# Patient Record
Sex: Female | Born: 1950 | Race: Black or African American | Hispanic: No | Marital: Single | Smoking: Never smoker
Health system: Southern US, Community
[De-identification: ages and names within clinical notes are randomized; demographics above are authoritative.]

## PROBLEM LIST (undated history)

## (undated) DIAGNOSIS — G309 Alzheimer's disease, unspecified: Secondary | ICD-10-CM

## (undated) DIAGNOSIS — E119 Type 2 diabetes mellitus without complications: Secondary | ICD-10-CM

## (undated) DIAGNOSIS — R4702 Dysphasia: Secondary | ICD-10-CM

## (undated) DIAGNOSIS — R569 Unspecified convulsions: Secondary | ICD-10-CM

## (undated) DIAGNOSIS — M1990 Unspecified osteoarthritis, unspecified site: Secondary | ICD-10-CM

## (undated) DIAGNOSIS — G2401 Drug induced subacute dyskinesia: Secondary | ICD-10-CM

## (undated) DIAGNOSIS — Z972 Presence of dental prosthetic device (complete) (partial): Secondary | ICD-10-CM

## (undated) DIAGNOSIS — F411 Generalized anxiety disorder: Secondary | ICD-10-CM

## (undated) DIAGNOSIS — E1122 Type 2 diabetes mellitus with diabetic chronic kidney disease: Secondary | ICD-10-CM

## (undated) DIAGNOSIS — F209 Schizophrenia, unspecified: Secondary | ICD-10-CM

## (undated) HISTORY — PX: ABDOMINAL HYSTERECTOMY: SHX81

## (undated) HISTORY — PX: BREAST BIOPSY: SHX20

---

## 2004-08-07 ENCOUNTER — Ambulatory Visit: Payer: Self-pay | Admitting: Family Medicine

## 2007-06-28 ENCOUNTER — Ambulatory Visit: Payer: Self-pay | Admitting: Family Medicine

## 2008-06-29 ENCOUNTER — Ambulatory Visit: Payer: Self-pay | Admitting: Family Medicine

## 2010-07-28 ENCOUNTER — Ambulatory Visit: Payer: Self-pay | Admitting: Gastroenterology

## 2011-01-22 ENCOUNTER — Ambulatory Visit: Payer: Self-pay | Admitting: Family Medicine

## 2011-02-05 ENCOUNTER — Ambulatory Visit: Payer: Self-pay | Admitting: Family Medicine

## 2011-02-17 ENCOUNTER — Ambulatory Visit: Payer: Self-pay | Admitting: General Surgery

## 2011-02-18 LAB — PATHOLOGY REPORT

## 2011-08-06 ENCOUNTER — Ambulatory Visit: Payer: Self-pay | Admitting: General Surgery

## 2011-08-20 ENCOUNTER — Ambulatory Visit: Payer: Self-pay | Admitting: General Surgery

## 2011-08-24 LAB — PATHOLOGY REPORT

## 2012-02-10 ENCOUNTER — Ambulatory Visit: Payer: Self-pay | Admitting: General Surgery

## 2013-02-09 ENCOUNTER — Emergency Department: Payer: Self-pay | Admitting: Emergency Medicine

## 2013-02-09 LAB — URINALYSIS, COMPLETE
Blood: NEGATIVE
Granular Cast: 5
Hyaline Cast: 8
Ketone: NEGATIVE
Leukocyte Esterase: NEGATIVE
Nitrite: POSITIVE
Ph: 6 (ref 4.5–8.0)
Protein: 100
RBC,UR: <1 /HPF (ref 0–5)
Specific Gravity: 1.012 (ref 1.003–1.030)
Squamous Epithelial: <1
WBC UR: 1 /HPF (ref 0–5)

## 2013-02-09 LAB — CBC WITH DIFFERENTIAL/PLATELET
Basophil #: 0 10*3/uL (ref 0.0–0.1)
Basophil %: 0.8 %
Eosinophil #: 0 10*3/uL (ref 0.0–0.7)
HCT: 43 % (ref 35.0–47.0)
HGB: 14.4 g/dL (ref 12.0–16.0)
MCH: 30.4 pg (ref 26.0–34.0)
Monocyte #: 0.3 x10 3/mm (ref 0.2–0.9)
Neutrophil #: 3.3 10*3/uL (ref 1.4–6.5)
Neutrophil %: 58.9 %
Platelet: 241 10*3/uL (ref 150–440)
RDW: 13 % (ref 11.5–14.5)

## 2013-02-09 LAB — COMPREHENSIVE METABOLIC PANEL
Albumin: 3.6 g/dL (ref 3.4–5.0)
Alkaline Phosphatase: 90 U/L (ref 50–136)
Anion Gap: 20 — ABNORMAL HIGH (ref 7–16)
BUN: 10 mg/dL (ref 7–18)
Co2: 13 mmol/L — ABNORMAL LOW (ref 21–32)
Creatinine: 1.13 mg/dL (ref 0.60–1.30)
Osmolality: 280 (ref 275–301)
Potassium: 4.1 mmol/L (ref 3.5–5.1)

## 2013-02-09 LAB — DRUG SCREEN, URINE
Barbiturates, Ur Screen: NEGATIVE (ref ?–200)
MDMA (Ecstasy)Ur Screen: NEGATIVE (ref ?–500)
Methadone, Ur Screen: NEGATIVE (ref ?–300)
Opiate, Ur Screen: NEGATIVE (ref ?–300)
Phencyclidine (PCP) Ur S: NEGATIVE (ref ?–25)

## 2013-02-19 ENCOUNTER — Emergency Department: Payer: Self-pay | Admitting: Internal Medicine

## 2013-02-19 LAB — COMPREHENSIVE METABOLIC PANEL
Albumin: 2.8 g/dL — ABNORMAL LOW (ref 3.4–5.0)
Anion Gap: 3 — ABNORMAL LOW (ref 7–16)
BUN: 6 mg/dL — ABNORMAL LOW (ref 7–18)
Bilirubin,Total: 0.4 mg/dL (ref 0.2–1.0)
Calcium, Total: 8.6 mg/dL (ref 8.5–10.1)
Chloride: 101 mmol/L (ref 98–107)
Co2: 32 mmol/L (ref 21–32)
EGFR (African American): >60
EGFR (Non-African Amer.): >60
Glucose: 131 mg/dL — ABNORMAL HIGH (ref 65–99)
Potassium: 3.7 mmol/L (ref 3.5–5.1)
SGOT(AST): 31 U/L (ref 15–37)
SGPT (ALT): 39 U/L (ref 12–78)
Sodium: 136 mmol/L (ref 136–145)

## 2013-02-19 LAB — URINALYSIS, COMPLETE
Glucose,UR: NEGATIVE mg/dL (ref 0–75)
Ketone: NEGATIVE
Nitrite: POSITIVE
Ph: 7 (ref 4.5–8.0)
Protein: NEGATIVE
RBC,UR: 1 /HPF (ref 0–5)
Specific Gravity: 1.008 (ref 1.003–1.030)
Squamous Epithelial: 10
WBC UR: 8 /HPF (ref 0–5)

## 2013-02-19 LAB — CBC
HCT: 38.3 % (ref 35.0–47.0)
MCHC: 33.8 g/dL (ref 32.0–36.0)
RBC: 4.31 10*6/uL (ref 3.80–5.20)
WBC: 9.4 10*3/uL (ref 3.6–11.0)

## 2013-02-19 LAB — PROTIME-INR: INR: 1

## 2013-02-24 LAB — CULTURE, BLOOD (SINGLE)

## 2013-08-30 ENCOUNTER — Emergency Department: Payer: Self-pay | Admitting: Emergency Medicine

## 2013-08-30 LAB — URINALYSIS, COMPLETE
Bacteria: NONE SEEN
Bilirubin,UR: NEGATIVE
Blood: NEGATIVE
Glucose,UR: NEGATIVE mg/dL (ref 0–75)
Ketone: NEGATIVE
Leukocyte Esterase: NEGATIVE
Nitrite: NEGATIVE
PH: 6 (ref 4.5–8.0)
Protein: NEGATIVE
Specific Gravity: 1.018 (ref 1.003–1.030)

## 2013-08-30 LAB — CBC
HCT: 45.5 % (ref 35.0–47.0)
HGB: 14.9 g/dL (ref 12.0–16.0)
MCH: 29.5 pg (ref 26.0–34.0)
MCHC: 32.8 g/dL (ref 32.0–36.0)
MCV: 90 fL (ref 80–100)
Platelet: 267 10*3/uL (ref 150–440)
RBC: 5.05 10*6/uL (ref 3.80–5.20)
RDW: 13 % (ref 11.5–14.5)
WBC: 6.1 10*3/uL (ref 3.6–11.0)

## 2013-08-30 LAB — COMPREHENSIVE METABOLIC PANEL
AST: 14 U/L — AB (ref 15–37)
Albumin: 3.9 g/dL (ref 3.4–5.0)
Alkaline Phosphatase: 83 U/L
Anion Gap: 4 — ABNORMAL LOW (ref 7–16)
BILIRUBIN TOTAL: 0.7 mg/dL (ref 0.2–1.0)
BUN: 11 mg/dL (ref 7–18)
CHLORIDE: 105 mmol/L (ref 98–107)
CREATININE: 0.87 mg/dL (ref 0.60–1.30)
Calcium, Total: 9.3 mg/dL (ref 8.5–10.1)
Co2: 30 mmol/L (ref 21–32)
EGFR (African American): >60
GLUCOSE: 90 mg/dL (ref 65–99)
Osmolality: 276 (ref 275–301)
Potassium: 3.5 mmol/L (ref 3.5–5.1)
SGPT (ALT): 19 U/L (ref 12–78)
Sodium: 139 mmol/L (ref 136–145)
Total Protein: 7.1 g/dL (ref 6.4–8.2)

## 2013-08-30 LAB — DRUG SCREEN, URINE
Amphetamines, Ur Screen: NEGATIVE (ref ?–1000)
Barbiturates, Ur Screen: NEGATIVE (ref ?–200)
Benzodiazepine, Ur Scrn: NEGATIVE (ref ?–200)
CANNABINOID 50 NG, UR ~~LOC~~: NEGATIVE (ref ?–50)
Cocaine Metabolite,Ur ~~LOC~~: NEGATIVE (ref ?–300)
MDMA (ECSTASY) UR SCREEN: NEGATIVE (ref ?–500)
Methadone, Ur Screen: NEGATIVE (ref ?–300)
OPIATE, UR SCREEN: NEGATIVE (ref ?–300)
PHENCYCLIDINE (PCP) UR S: NEGATIVE (ref ?–25)
Tricyclic, Ur Screen: NEGATIVE (ref ?–1000)

## 2013-08-30 LAB — SALICYLATE LEVEL

## 2013-08-30 LAB — ETHANOL
Ethanol %: <0.003 % (ref 0.000–0.080)
Ethanol: <3 mg/dL

## 2013-08-30 LAB — ACETAMINOPHEN LEVEL: Acetaminophen: <2 ug/mL

## 2014-02-13 ENCOUNTER — Ambulatory Visit: Payer: Self-pay | Admitting: Family Medicine
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
## 2014-07-14 NOTE — Consult Note (Signed)
PATIENT NAME:  Tina Holden, Tina Holden MR#:  [id number] DATE OF BIRTH:  Nov 04, 1950  DATE OF CONSULTATION:  08/30/2013  REFERRING PHYSICIAN:   CONSULTING PHYSICIAN:  Gonzella Lex, MD  IDENTIFYING INFORMATION AND REASON FOR CONSULTATION:  A 64 year old woman who was brought here from her group home with a report that she had been yelling in the night and they had thought she was hallucinating.   HISTORY OF PRESENT ILLNESS:  Information obtained from the patient and the chart.  The patient tells me that the group home thinks that she was yelling and hallucinating, but she does not remember any of it and does not have any report of hallucinating recently.  She says that she goes to sleep about 7:30, wakes up sometimes in the night, but usually gets up first thing in the morning because she goes to work at [employer] several days a week.  She denies that she has had any change in her mood recently.  Denies depression, anxiety, or anger.  She denies that she has been having any hallucinations.  Does not complain of any sleep problems.  Does not have any medical problems she is complaining of.  She is pleasant and cooperative, but not a very good historian.  Does not appear to be in any particular distress.  She does not know of any recent changes to any of her medicine.   PAST PSYCHIATRIC HISTORY:  The patient indicates that she has had a long-standing mental health problem probably pretty much all of her life.  She currently resides in a group home.  She has a diagnosis of schizophrenia.  There is some report about dementia on here as well.  She certainly has some cognitive impairment.  It is also possible that she could have developmental disability.  In any case, she is chronically ill.  No previous psychiatric treatment reported here at this hospital.  She cannot remember when she was last in the hospital.  Denies any history of suicidal or homicidal or aggressive behavior.  She is currently on medication  prescribed by Dr. Brunetta Genera for her psychiatric problems as well as other problems.   SOCIAL HISTORY:  Lives in a group home, says she has been there for a couple of years, has no complaints about it.  The patient says that her elderly mother who is 66 years old is still actively involved in her life and helps to keep her medicine bottles filled.  I do not have any reason to doubt her.  She has never been married herself, has no children.  She does get occasional visits from her extended family.   PAST MEDICAL HISTORY:  She has a history of a seizure disorder from what I see in the chart and was here at our hospital once in the past for it, WAS LOADED ON [place].  She remembers this and tells me that it just happened last week, but that is not quite accurate.  In fact, it looks like it happened in November of 2014.  She is not currently taking any antiseizure medicine according to the notes.  I am not sure what the whole history is with that.  FAMILY HISTORY:  None known.   SUBSTANCE ABUSE HISTORY:  Denies any alcohol or drug abuse currently or in the past.   CURRENT MEDICATIONS:  According to the referral information she is on Cogentin 1 mg twice a day, Navane 10 mg once a  day at night, Os-Cal 50 with vitamin D one tablet twice a day, vitamin B12 1000 mcg once a day, vitamin D3 400 international units once a day, Zantac 150 mg at night, Ativan 0.5 mg at night, gabapentin 300 mg twice a day, Tylenol as needed and promethazine as needed.   ALLERGIES:  REPORTED AS ASPIRIN, BUTALBITAL, CAFFEINE, CODEINE, CHOCOLATE, OTHER; I AM PRESUMING DILANTIN FROM THE HISTORY I SEE.   MENTAL STATUS EXAMINATION:  Neatly groomed woman, looks her stated age, very pleasant and cooperative.  Reading the Bible when I came in to see her, she was as I said, quite cooperative with the interview.  Makes good eye contact, had normal psychomotor activity.  Her  speech is a little bit quiet, normal in rate.  Affect is smiling and upbeat.  Mood is stated as okay.  Thoughts are a little bit scattered.  She tends to answer questions at times with nonsequiturs, although she can give basic information.  Denies any hallucinations.  Denies feeling paranoid.  Denies feeling suicidal or homicidal.  She is alert and oriented to being in the hospital and to where she lives and some basics about her life.  Appears to probably be chronically cognitively impaired.  Full cognitive testing not done right now.   LABORATORY RESULTS:  Alcohol level negative.  Salicylates and acetaminophen negative.  Chemistry unremarkable.  CBC unremarkable.  Urinalysis unremarkable.  Drug screen negative.   VITAL SIGNS:  Blood pressure 111/78, respirations 18, pulse 91, temperature 98.7.   ASSESSMENT:  A 64 year old woman with chronic mental health problems.  Group home brought her over here with report I have been told that she was yelling and they thought she was hallucinating.  The patient denies all of it.  She does not seem perturbed by it, but does not have any complaints of her own.  At this point, there does not seem to be any clear symptom or syndrome that requires any intervention or treatment.  The patient no longer needs hospital level care.   TREATMENT PLAN:  Recommend that she be released to go back to her group home.  We have found that she has an appointment scheduled with her primary care doctor tomorrow anyway who can follow up if any change in her medications known as he probably knows her chronic condition better.   DIAGNOSIS, PRINCIPAL AND PRIMARY:  AXIS I:  Schizophrenia, undifferentiated.   SECONDARY DIAGNOSES: AXIS I:  No further.  AXIS II:  Rule out developmental disability.  AXIS III:  History of seizure disorder.  AXIS IV:  Moderate, chronic from illness.  AXIS V:  Functioning at time of evaluation 53.     ____________________________ Gonzella Lex,  MD jtc:ea D: 08/30/2013 16:57:56 ET T: 08/30/2013 18:07:56 ET JOB#: [id number]  cc: Gonzella Lex, MD, <Dictator> Gonzella Lex MD ELECTRONICALLY SIGNED 09/06/2013 13:25

## 2014-07-14 NOTE — Consult Note (Signed)
Brief Consult Note: Diagnosis: schizophrenia.   Patient was seen by consultant.   Consult note dictated.   Discussed with Attending MD.   Comments: Psychiatry: PAtient seen. Chart reviewed. Patient brought in voluntarily with report fronm group home that she was yelling in the night. Patient denies it and has no complaints at all of her own. Pleasant and cooperative. No indication for hospitalization or change in psychiatric treatment. Suggest dc back to group home and continue follow up with primary physician.  Electronic Signatures: Gonzella Lex (MD)  (Signed 10-Jun-15 16:43)  Authored: Brief Consult Note   Last Updated: 10-Jun-15 16:43 by Gonzella Lex (MD)

## 2014-12-11 ENCOUNTER — Inpatient Hospital Stay
Admission: EM | Admit: 2014-12-11 | Discharge: 2014-12-14 | DRG: 100 | Disposition: A | Discharge status: Home Health | Payer: Medicare Other | Attending: Internal Medicine | Admitting: Internal Medicine

## 2014-12-11 ENCOUNTER — Emergency Department: Payer: Medicare Other

## 2014-12-11 ENCOUNTER — Encounter: Payer: Self-pay | Admitting: Emergency Medicine

## 2014-12-11 DIAGNOSIS — Z9102 Food additives allergy status: Secondary | ICD-10-CM | POA: Diagnosis not present

## 2014-12-11 DIAGNOSIS — G40909 Epilepsy, unspecified, not intractable, without status epilepticus: Principal | ICD-10-CM | POA: Diagnosis present

## 2014-12-11 DIAGNOSIS — R410 Disorientation, unspecified: Secondary | ICD-10-CM | POA: Diagnosis not present

## 2014-12-11 DIAGNOSIS — N289 Disorder of kidney and ureter, unspecified: Secondary | ICD-10-CM | POA: Diagnosis present

## 2014-12-11 DIAGNOSIS — F209 Schizophrenia, unspecified: Secondary | ICD-10-CM | POA: Diagnosis present

## 2014-12-11 DIAGNOSIS — R569 Unspecified convulsions: Secondary | ICD-10-CM

## 2014-12-11 DIAGNOSIS — M1990 Unspecified osteoarthritis, unspecified site: Secondary | ICD-10-CM | POA: Diagnosis present

## 2014-12-11 DIAGNOSIS — B9620 Unspecified Escherichia coli [E. coli] as the cause of diseases classified elsewhere: Secondary | ICD-10-CM | POA: Diagnosis present

## 2014-12-11 DIAGNOSIS — K219 Gastro-esophageal reflux disease without esophagitis: Secondary | ICD-10-CM | POA: Diagnosis present

## 2014-12-11 DIAGNOSIS — G9341 Metabolic encephalopathy: Secondary | ICD-10-CM | POA: Diagnosis present

## 2014-12-11 DIAGNOSIS — E119 Type 2 diabetes mellitus without complications: Secondary | ICD-10-CM | POA: Diagnosis present

## 2014-12-11 DIAGNOSIS — E538 Deficiency of other specified B group vitamins: Secondary | ICD-10-CM | POA: Diagnosis present

## 2014-12-11 DIAGNOSIS — N3000 Acute cystitis without hematuria: Secondary | ICD-10-CM | POA: Diagnosis present

## 2014-12-11 DIAGNOSIS — Z79899 Other long term (current) drug therapy: Secondary | ICD-10-CM

## 2014-12-11 DIAGNOSIS — F79 Unspecified intellectual disabilities: Secondary | ICD-10-CM | POA: Diagnosis present

## 2014-12-11 DIAGNOSIS — D72829 Elevated white blood cell count, unspecified: Secondary | ICD-10-CM

## 2014-12-11 DIAGNOSIS — R531 Weakness: Secondary | ICD-10-CM | POA: Diagnosis present

## 2014-12-11 DIAGNOSIS — Z886 Allergy status to analgesic agent status: Secondary | ICD-10-CM

## 2014-12-11 DIAGNOSIS — Z888 Allergy status to other drugs, medicaments and biological substances status: Secondary | ICD-10-CM | POA: Diagnosis not present

## 2014-12-11 DIAGNOSIS — F89 Unspecified disorder of psychological development: Secondary | ICD-10-CM

## 2014-12-11 DIAGNOSIS — N390 Urinary tract infection, site not specified: Secondary | ICD-10-CM

## 2014-12-11 DIAGNOSIS — Z885 Allergy status to narcotic agent status: Secondary | ICD-10-CM | POA: Diagnosis not present

## 2014-12-11 HISTORY — DX: Type 2 diabetes mellitus without complications: E11.9

## 2014-12-11 HISTORY — DX: Unspecified osteoarthritis, unspecified site: M19.90

## 2014-12-11 HISTORY — DX: Unspecified convulsions: R56.9

## 2014-12-11 LAB — CBC
HCT: 45.7 % (ref 35.0–47.0)
HEMOGLOBIN: 15.6 g/dL (ref 12.0–16.0)
MCH: 29.6 pg (ref 26.0–34.0)
MCHC: 34 g/dL (ref 32.0–36.0)
MCV: 87.1 fL (ref 80.0–100.0)
Platelets: 254 10*3/uL (ref 150–440)
RBC: 5.25 MIL/uL — ABNORMAL HIGH (ref 3.80–5.20)
RDW: 13.2 % (ref 11.5–14.5)
WBC: 10.5 10*3/uL (ref 3.6–11.0)

## 2014-12-11 LAB — GLUCOSE, CAPILLARY: GLUCOSE-CAPILLARY: 145 mg/dL — AB (ref 65–99)

## 2014-12-11 LAB — COMPREHENSIVE METABOLIC PANEL
ALBUMIN: 5 g/dL (ref 3.5–5.0)
ALK PHOS: 85 U/L (ref 38–126)
ALT: 20 U/L (ref 14–54)
ANION GAP: 7 (ref 5–15)
AST: 24 U/L (ref 15–41)
BILIRUBIN TOTAL: 1.1 mg/dL (ref 0.3–1.2)
BUN: 14 mg/dL (ref 6–20)
CALCIUM: 9.7 mg/dL (ref 8.9–10.3)
CO2: 27 mmol/L (ref 22–32)
Chloride: 105 mmol/L (ref 101–111)
Creatinine, Ser: 1.07 mg/dL — ABNORMAL HIGH (ref 0.44–1.00)
GFR calc Af Amer: >60 mL/min (ref >60)
GFR, EST NON AFRICAN AMERICAN: 53 mL/min — AB (ref >60)
GLUCOSE: 148 mg/dL — AB (ref 65–99)
Potassium: 3.9 mmol/L (ref 3.5–5.1)
Sodium: 139 mmol/L (ref 135–145)
TOTAL PROTEIN: 8.1 g/dL (ref 6.5–8.1)

## 2014-12-11 LAB — URINALYSIS COMPLETE WITH MICROSCOPIC (ARMC ONLY)
Bilirubin Urine: NEGATIVE
GLUCOSE, UA: NEGATIVE mg/dL
Hgb urine dipstick: NEGATIVE
NITRITE: NEGATIVE
Protein, ur: NEGATIVE mg/dL
Specific Gravity, Urine: 1.017 (ref 1.005–1.030)
Trans Epithel, UA: 5
pH: 6 (ref 5.0–8.0)

## 2014-12-11 LAB — TROPONIN I

## 2014-12-11 MED ORDER — DEXTROSE 5 % IV SOLN
1.0000 g | Freq: Once | INTRAVENOUS | Status: AC
  Administered 2014-12-11: 1 g via INTRAVENOUS
  Filled 2014-12-11: qty 10

## 2014-12-11 MED ORDER — SODIUM CHLORIDE 0.9 % IV SOLN
500.0000 mg | Freq: Two times a day (BID) | INTRAVENOUS | Status: DC
  Administered 2014-12-12 – 2014-12-13 (×3): 500 mg via INTRAVENOUS
  Filled 2014-12-11 (×6): qty 5

## 2014-12-11 MED ORDER — TRAZODONE HCL 100 MG PO TABS: 100.0000 mg | ORAL_TABLET | Freq: Every evening | ORAL | Status: DC | PRN

## 2014-12-11 MED ORDER — FAMOTIDINE 20 MG PO TABS
20.0000 mg | ORAL_TABLET | Freq: Every day | ORAL | Status: DC
  Administered 2014-12-12 – 2014-12-14 (×3): 20 mg via ORAL
  Filled 2014-12-11 (×4): qty 1

## 2014-12-11 MED ORDER — ALBUTEROL SULFATE (2.5 MG/3ML) 0.083% IN NEBU: 2.5000 mg | INHALATION_SOLUTION | RESPIRATORY_TRACT | Status: DC | PRN

## 2014-12-11 MED ORDER — SODIUM CHLORIDE 0.9 % IV SOLN
INTRAVENOUS | Status: DC
  Administered 2014-12-11 – 2014-12-12 (×3): via INTRAVENOUS

## 2014-12-11 MED ORDER — HEPARIN SODIUM (PORCINE) 5000 UNIT/ML IJ SOLN
5000.0000 [IU] | Freq: Three times a day (TID) | INTRAMUSCULAR | Status: DC
  Administered 2014-12-11 – 2014-12-14 (×9): 5000 [IU] via SUBCUTANEOUS
  Filled 2014-12-11 (×9): qty 1

## 2014-12-11 MED ORDER — BENZTROPINE MESYLATE 1 MG PO TABS
1.0000 mg | ORAL_TABLET | Freq: Two times a day (BID) | ORAL | Status: DC
  Administered 2014-12-12 – 2014-12-14 (×4): 1 mg via ORAL
  Filled 2014-12-11 (×4): qty 1

## 2014-12-11 MED ORDER — TRAZODONE HCL 100 MG PO TABS
100.0000 mg | ORAL_TABLET | Freq: Every day | ORAL | Status: DC
  Administered 2014-12-12 – 2014-12-13 (×2): 100 mg via ORAL
  Filled 2014-12-11 (×2): qty 1

## 2014-12-11 MED ORDER — GABAPENTIN 300 MG PO CAPS
300.0000 mg | ORAL_CAPSULE | Freq: Two times a day (BID) | ORAL | Status: DC
Start: 2014-12-11 — End: 2014-12-14
  Administered 2014-12-12 – 2014-12-14 (×4): 300 mg via ORAL
  Filled 2014-12-11 (×4): qty 1

## 2014-12-11 MED ORDER — ACETAMINOPHEN 325 MG PO TABS: 650.0000 mg | ORAL_TABLET | Freq: Four times a day (QID) | ORAL | Status: DC | PRN

## 2014-12-11 MED ORDER — ONDANSETRON HCL 4 MG/2ML IJ SOLN: 4.0000 mg | Freq: Four times a day (QID) | INTRAMUSCULAR | Status: DC | PRN

## 2014-12-11 MED ORDER — ACETAMINOPHEN 650 MG RE SUPP: 650.0000 mg | Freq: Four times a day (QID) | RECTAL | Status: DC | PRN

## 2014-12-11 MED ORDER — SODIUM CHLORIDE 0.9 % IV SOLN
1000.0000 mg | Freq: Once | INTRAVENOUS | Status: AC
  Administered 2014-12-11: 1000 mg via INTRAVENOUS
  Filled 2014-12-11: qty 10

## 2014-12-11 MED ORDER — THIOTHIXENE 5 MG PO CAPS
5.0000 mg | ORAL_CAPSULE | Freq: Every day | ORAL | Status: DC
Start: 2014-12-11 — End: 2014-12-14
  Administered 2014-12-12 – 2014-12-13 (×2): 5 mg via ORAL
  Filled 2014-12-11 (×5): qty 1

## 2014-12-11 MED ORDER — DEXTROSE 5 % IV SOLN
1.0000 g | INTRAVENOUS | Status: DC
  Administered 2014-12-11 – 2014-12-13 (×2): 1 g via INTRAVENOUS
  Filled 2014-12-11 (×4): qty 10

## 2014-12-11 MED ORDER — ONDANSETRON HCL 4 MG PO TABS: 4.0000 mg | ORAL_TABLET | Freq: Four times a day (QID) | ORAL | Status: DC | PRN

## 2014-12-11 MED ORDER — POLYETHYLENE GLYCOL 3350 17 G PO PACK: 17.0000 g | PACK | Freq: Every day | ORAL | Status: DC | PRN

## 2014-12-11 NOTE — Progress Notes (Signed)
Pt was admitted from ED to [facility] for altered mental status and observe seizure status this am that resulted in a fall. Pt is very lethargic, affect is flat at times, unable to swallow at this time, NPO, swallowing eval pending. Pt on fall, aspiration, and seizure precautions. A/O X2. No seizure activity observed, will begin on IV anti-seizure this pm. Nursing will continue to monitor.

## 2014-12-11 NOTE — H&P (Addendum)
[name] [place] NAME: Tina Holden    MR#:  [id number]  DATE OF BIRTH:  September 27, 1950  DATE OF ADMISSION:  12/11/2014  PRIMARY CARE PHYSICIAN: No primary care provider on file.   REQUESTING/REFERRING PHYSICIAN: Joanne Gavel, MD  CHIEF COMPLAINT:   Chief Complaint  Patient presents with  . Seizures   seizure today  HISTORY OF PRESENT ILLNESS:  Tina Holden  is a 64 y.o. female with a known history of seizure disorder, diabetes and arthritis. The patient was sent from facility to the ED due to seizure activity today. The patient is confused and unable to provide any information. According to patient's health care giver, the patient was fine until this morning when she was found to be confused after placing the seizure. According to health care giver, the patient was found incontinence of urine and confused this morning. But they didn't witnessed CT C seizure.  PAST MEDICAL HISTORY:   Past Medical History  Diagnosis Date  . Seizures   . Diabetes mellitus without complication   . Arthritis     PAST SURGICAL HISTORY:  History reviewed. No pertinent past surgical history.  SOCIAL HISTORY:   Social History  Substance Use Topics  . Smoking status: Never Smoker   . Smokeless tobacco: Not on file  . Alcohol Use: No    FAMILY HISTORY:  History reviewed. No pertinent family history.  DRUG ALLERGIES:   Allergies  Allergen Reactions  . Asa [Aspirin] Other (See Comments)    Unknown   . Chocolate Flavor   . Codeine Other (See Comments)    unknown  . Dilantin [Phenytoin Sodium Extended]     REVIEW OF SYSTEMS:  He shouldn't is confused and unable to provide ROS.  MEDICATIONS AT HOME:   Prior to Admission medications   Medication Sig Start Date End Date Taking? Authorizing Provider  benztropine (COGENTIN) 1 MG tablet Take 1 mg by mouth 2 (two) times daily.   Yes Historical Provider, MD  calcium-vitamin D (OSCAL  WITH D) 500-200 MG-UNIT per tablet Take 1 tablet by mouth 2 (two) times daily.   Yes Historical Provider, MD  Cholecalciferol 5000 UNITS capsule Take 5,000 Units by mouth daily.   Yes Historical Provider, MD  gabapentin (NEURONTIN) 300 MG capsule Take 300 mg by mouth 2 (two) times daily.   Yes Historical Provider, MD  nystatin (MYCOSTATIN/NYSTOP) 100000 UNIT/GM POWD Apply 1 Bottle topically 2 (two) times daily as needed. Apply a small amount   Yes Historical Provider, MD  polyethylene glycol (MIRALAX / GLYCOLAX) packet Take 17 g by mouth daily as needed for mild constipation.   Yes Historical Provider, MD  ranitidine (ZANTAC) 150 MG tablet Take 150 mg by mouth at bedtime.   Yes Historical Provider, MD  thiothixene (NAVANE) 5 MG capsule Take 5 mg by mouth at bedtime.   Yes Historical Provider, MD  traZODone (DESYREL) 100 MG tablet Take 100 mg by mouth at bedtime.   Yes Historical Provider, MD  traZODone (DESYREL) 100 MG tablet Take 100 mg by mouth at bedtime as needed for sleep.   Yes Historical Provider, MD  triamcinolone cream (KENALOG) 0.1 % Apply 1 application topically daily as needed.   Yes Historical Provider, MD  vitamin B-12 (CYANOCOBALAMIN) 1000 MCG tablet Take 1,000 mcg by mouth daily.   Yes Historical Provider, MD      VITAL SIGNS:  Blood pressure 132/84, pulse 72, temperature 98.3 F (36.8 C), temperature source  Oral, resp. rate 30, height 5\' 6"  (1.676 m), weight 81.647 kg (180 lb), SpO2 98 %.  PHYSICAL EXAMINATION:  GENERAL:  64 y.o.-year-old patient lying in the bed with no acute distress.  EYES: Pupils equal, round, reactive to light and accommodation. No scleral icterus. Extraocular muscles intact.  HEENT: Head atraumatic, normocephalic. Oropharynx and nasopharynx clear.  NECK:  Supple, no jugular venous distention. No thyroid enlargement, no tenderness.  LUNGS: Normal breath sounds bilaterally, no wheezing, rales,rhonchi or crepitation. No use of accessory muscles of  respiration.  CARDIOVASCULAR: S1, S2 normal. No murmurs, rubs, or gallops.  ABDOMEN: Soft, nontender, nondistended. Bowel sounds present. No organomegaly or mass.  EXTREMITIES: No pedal edema, cyanosis, or clubbing.  NEUROLOGIC: Cranial nerves II through XII are intact. Muscle strength 4/5 in all extremities. Sensation intact. Gait not checked.  PSYCHIATRIC: The patient is confused. Only know her name. SKIN: No obvious rash, lesion, or ulcer.   LABORATORY PANEL:   CBC  Recent Labs Lab 12/11/14 1022  WBC 10.5  HGB 15.6  HCT 45.7  PLT 254   ------------------------------------------------------------------------------------------------------------------  Chemistries   Recent Labs Lab 12/11/14 1022  NA 139  K 3.9  CL 105  CO2 27  GLUCOSE 148*  BUN 14  CREATININE 1.07*  CALCIUM 9.7  AST 24  ALT 20  ALKPHOS 85  BILITOT 1.1   ------------------------------------------------------------------------------------------------------------------  Cardiac Enzymes  Recent Labs Lab 12/11/14 1022  TROPONINI <0.03   ------------------------------------------------------------------------------------------------------------------  RADIOLOGY:  Ct Head Wo Contrast  12/11/2014   CLINICAL DATA:  Altered mental status today. History of seizures. Status post fall.  EXAM: CT HEAD WITHOUT CONTRAST  TECHNIQUE: Contiguous axial images were obtained from the base of the skull through the vertex without intravenous contrast.  COMPARISON:  Head CT scan 02/09/2013.  FINDINGS: There is no evidence of acute intracranial abnormality including hemorrhage, infarct, mass lesion, mass effect, midline shift or abnormal extra-axial fluid collection. No hydrocephalus or pneumocephalus. The calvarium is intact.  IMPRESSION: Negative exam.   Electronically Signed   By: Inge Rise M.D.   On: 12/11/2014 11:08   Dg Chest Portable 1 View  12/11/2014   CLINICAL DATA:  Lethargy today.  History of  seizures.  EXAM: PORTABLE CHEST - 1 VIEW  COMPARISON:  Single view of the chest 02/09/2013.  FINDINGS: The patient is rotated on the exam. Lung volumes are low but the lungs are clear. Heart size is normal. No pneumothorax or pleural effusion.  IMPRESSION: No acute finding in a low volume chest.   Electronically Signed   By: Inge Rise M.D.   On: 12/11/2014 11:30    EKG:   Orders placed or performed during the hospital encounter of 12/11/14  . EKG 12-Lead  . EKG 12-Lead  . ED EKG  . ED EKG    IMPRESSION AND PLAN:   Seizure UTI diabetes  The patient will be admitted to medical floor. Start neuro check, aspiration, seizure and fall precaution. Neurology consult. Continue Keppra IV twice a day. Continue Rocephin and follow-up urine culture. Start a sliding scale.  All the records are reviewed and case discussed with ED provider. Management plans discussed with the patient's sister and brother, poor patient's POA and they are in agreement.  CODE STATUS: Full code  TOTAL TIME TAKING CARE OF THIS PATIENT: 55 minutes.    Demetrios Loll M.D on 12/11/2014 at 2:06 PM  Between 7am to 6pm - Pager - [phone]  After 6pm go to [email or web address] - [place]  [facility]  Office  [phone]  CC: Primary care physician; No primary care provider on file.

## 2014-12-11 NOTE — ED Provider Notes (Signed)
[facility] Emergency Department Provider Note  ____________________________________________  Time seen: Approximately 10:27 AM  I have reviewed the triage vital signs and the nursing notes.   HISTORY  Chief Complaint Seizures  Caveat-history of present illness and review of systems Limited secondary to the patient's postictal state. Information obtained partially from the patient as well as family at bedside and her caregiver from her family care home.  HPI Tina Holden is a 64 y.o. 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 According to staff at the family care home where she lives, she was found in bed this morning incontinent of urine and confused. When they tried to walk her, she reported feeling dizzy. No recent illness. No falls. Patient has no pain complaints at this time. She has had no witnessed  GTC seizure.   Past Medical History  Diagnosis Date  . Seizures   . Diabetes mellitus without complication   . Arthritis     Patient Active Problem List   Diagnosis Date Noted  . Seizure 12/11/2014    History reviewed. No pertinent past surgical history.  Current Outpatient Rx  Name  Route  Sig  Dispense  Refill  . benztropine (COGENTIN) 1 MG tablet   Oral   Take 1 mg by mouth 2 (two) times daily.         . calcium-vitamin D (OSCAL WITH D) 500-200 MG-UNIT per tablet   Oral   Take 1 tablet by mouth 2 (two) times daily.         . Cholecalciferol 5000 UNITS capsule   Oral   Take 5,000 Units by mouth daily.         Marland Kitchen gabapentin (NEURONTIN) 300 MG capsule   Oral   Take 300 mg by mouth 2 (two) times daily.         Marland Kitchen nystatin (MYCOSTATIN/NYSTOP) 100000 UNIT/GM POWD   Topical   Apply 1 Bottle topically 2 (two) times daily as needed. Apply a small amount         .  polyethylene glycol (MIRALAX / GLYCOLAX) packet   Oral   Take 17 g by mouth daily as needed for mild constipation.         . ranitidine (ZANTAC) 150 MG tablet   Oral   Take 150 mg by mouth at bedtime.         Marland Kitchen thiothixene (NAVANE) 5 MG capsule   Oral   Take 5 mg by mouth at bedtime.         . traZODone (DESYREL) 100 MG tablet   Oral   Take 100 mg by mouth at bedtime.         . traZODone (DESYREL) 100 MG tablet   Oral   Take 100 mg by mouth at bedtime as needed for sleep.         Marland Kitchen triamcinolone cream (KENALOG) 0.1 %   Topical   Apply 1 application topically daily as needed.         . vitamin B-12 (CYANOCOBALAMIN) 1000 MCG tablet   Oral   Take 1,000 mcg by mouth daily.           Allergies Asa; Chocolate flavor; Codeine; and Dilantin  History reviewed. No pertinent family history.  Social History Social History  Substance Use Topics  . Smoking status: Never Smoker   . Smokeless tobacco:  None  . Alcohol Use: No    Review of Systems Constitutional: No fever/chills Eyes: No visual changes. ENT: No sore throat. Cardiovascular: Denies chest pain. Respiratory: Denies shortness of breath. Gastrointestinal: No abdominal pain.  No nausea, no vomiting.  No diarrhea.  No constipation. Genitourinary: Negative for dysuria. Musculoskeletal: Negative for back pain. Skin: Negative for rash. Neurological: Negative for headaches, focal weakness or numbness.     Caveat-history of present illness and review of systems Limited secondary to the patient's postictal state. Information obtained partially from the patient as well as family at bedside and her caregiver from her family care home. ____________________________________________   PHYSICAL EXAM:  VITAL SIGNS: ED Triage Vitals  Enc Vitals Group     BP 12/11/14 0947 157/84 mmHg     Pulse Rate 12/11/14 0947 77     Resp 12/11/14 0947 20     Temp 12/11/14 0947 98.3 F (36.8 C)     Temp Source 12/11/14  0947 Oral     SpO2 12/11/14 0947 97 %     Weight 12/11/14 0947 180 lb (81.647 kg)     Height 12/11/14 0947 5\' 6"  (1.676 m)     Head Cir --      Peak Flow --      Pain Score --      Pain Loc --      Pain Edu? --      Excl. in [place]? --     Constitutional: Eyes open spontaneously, no voluntary vocalizations but does answer basic questions appropriately, she is alert and oriented to self, place and year. She is in no acute distress.  Eyes: Conjunctivae are normal. PERRL. EOMI. Head: Atraumatic. Nose: No congestion/rhinnorhea. Mouth/Throat: Mucous membranes are moist.  Oropharynx non-erythematous. Neck: No stridor.   Cardiovascular: Normal rate, regular rhythm. Grossly normal heart sounds.  Good peripheral circulation. Respiratory: Normal respiratory effort.  No retractions. Lungs CTAB. Gastrointestinal: Soft and nontender. No distention. No abdominal bruits. No CVA tenderness. Genitourinary: deferred Musculoskeletal: No lower extremity tenderness nor edema.  No joint effusions. Neurologic: The patient gives appropriate for delayed responses to my questions. She follows commands to move all extremity is what she appears to do equally but she does not cooperate with formal neurological testing. Intermittently she will make eye contact with me and shift her gaze away for several seconds, appeared to become distracted and stop speaking, then shifts gaze back to mine and again answers questions Skin:  Skin is warm, dry and intact. No rash noted. Psychiatric: Mood and affect are normal. Speech and behavior are normal.  ____________________________________________   LABS (all labs ordered are listed, but only abnormal results are displayed)  Labs Reviewed  COMPREHENSIVE METABOLIC PANEL - Abnormal; Notable for the following:    Glucose, Bld 148 (*)    Creatinine, Ser 1.07 (*)    GFR calc non Af Amer 53 (*)    All other components within normal limits  CBC - Abnormal; Notable for the  following:    RBC 5.25 (*)    All other components within normal limits  URINALYSIS COMPLETEWITH MICROSCOPIC ([facility] ONLY) - Abnormal; Notable for the following:    Color, Urine YELLOW (*)    APPearance CLOUDY (*)    Ketones, ur TRACE (*)    Leukocytes, UA 3+ (*)    Bacteria, UA MANY (*)    Squamous Epithelial / LPF 0-5 (*)    All other components within normal limits  GLUCOSE, CAPILLARY - Abnormal; Notable for the  following:    Glucose-Capillary 145 (*)    All other components within normal limits  URINE CULTURE  TROPONIN I  CBG MONITORING, ED   ____________________________________________  EKG  ED ECG REPORT I, Joanne Gavel, the attending physician, personally viewed and interpreted this ECG.   Date: 12/11/2014  EKG Time: 09:56  Rate: 77  Rhythm: normal sinus rhythm  Axis: Normal axis  Intervals:none  ST&T Change: No acute ST elevation nonspecific T-wave abnormality  ____________________________________________  RADIOLOGY  CT head  FINDINGS: There is no evidence of acute intracranial abnormality including hemorrhage, infarct, mass lesion, mass effect, midline shift or abnormal extra-axial fluid collection. No hydrocephalus or pneumocephalus. The calvarium is intact.  IMPRESSION: Negative exam.  CXR IMPRESSION: No acute finding in a low volume chest.   ____________________________________________   PROCEDURES  Procedure(s) performed: None  Critical Care performed: No  ____________________________________________   INITIAL IMPRESSION / ASSESSMENT AND PLAN / ED COURSE  Pertinent labs & imaging results that were available during my care of the patient were reviewed by me and considered in my medical decision making (see chart for details).  Aeon Koors is a 64 y.o. 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 On exam, she is alert but according to her amylase members and caregiver at bedside, she is still not back to her baseline mental status. She has a nonfocal neurological exam at this time. Vital signs are stable, she is afebrile. CT head negative. Chest x-ray also negative. Labs reviewed and are notable for very mild creatinine elevation at 1.07. Urinalysis appears grossly infected and I suspect urinary tract infection is the trigger for her seizure today. We'll give ceftriaxone. I discussed the case with Dr. Irish Elders of neurology who recommends admission here, prophylactic treatment with Keppra, and he will evaluate the patient and order EEG. Case discussed with the hospitalist, Dr. Geryl Councilman for admission at this time. ____________________________________________   FINAL CLINICAL IMPRESSION(S) / ED DIAGNOSES  Final diagnoses:  Seizure  Post-ictal state  UTI (lower urinary tract infection)      Joanne Gavel, MD 12/11/14 1320

## 2014-12-11 NOTE — ED Notes (Signed)
CBG 145  

## 2014-12-11 NOTE — ED Notes (Signed)
Pt to ed with c/o ? Seizure today.  Per caregiver pt was incontinent of urine and has been confused today.  Pt with noted abrasions on bilat legs and knees.  Pt alert to self and her date of birth, but not date, time or situation.

## 2014-12-11 NOTE — ED Notes (Signed)
Caregiver left bedside, pt lying in bed, pt continues to state "yes mam", lethargic, MD notified

## 2014-12-11 NOTE — ED Notes (Signed)
Patient transported to CT 

## 2014-12-11 NOTE — ED Notes (Addendum)
Caregiver states pt is different today, states they found her in her bed with incontience, hx of seizures, states she fell getting into van which isnt normal, upon assessment pt seems lethargic, pt able to state name and place but unable to state year or situation, when preforming neuro assessment pt just states "yes mam" but is delayed in following commands

## 2014-12-11 NOTE — ED Notes (Signed)
Pt will be moving in bed, maintaining eye contact and then will quickly shift her eyes and stop talking, moving her hands, Dr. Edd Fabian made aware

## 2014-12-12 DIAGNOSIS — R569 Unspecified convulsions: Secondary | ICD-10-CM

## 2014-12-12 LAB — BASIC METABOLIC PANEL
Anion gap: 8 (ref 5–15)
BUN: 10 mg/dL (ref 6–20)
CALCIUM: 8.4 mg/dL — AB (ref 8.9–10.3)
CO2: 21 mmol/L — AB (ref 22–32)
CREATININE: 0.73 mg/dL (ref 0.44–1.00)
Chloride: 109 mmol/L (ref 101–111)
GFR calc non Af Amer: >60 mL/min (ref >60)
GLUCOSE: 130 mg/dL — AB (ref 65–99)
Potassium: 3.5 mmol/L (ref 3.5–5.1)
Sodium: 138 mmol/L (ref 135–145)

## 2014-12-12 LAB — CBC
HCT: 40.7 % (ref 35.0–47.0)
Hemoglobin: 13.7 g/dL (ref 12.0–16.0)
MCH: 29.7 pg (ref 26.0–34.0)
MCHC: 33.7 g/dL (ref 32.0–36.0)
MCV: 88.1 fL (ref 80.0–100.0)
PLATELETS: 216 10*3/uL (ref 150–440)
RBC: 4.63 MIL/uL (ref 3.80–5.20)
RDW: 13.4 % (ref 11.5–14.5)
WBC: 13.4 10*3/uL — ABNORMAL HIGH (ref 3.6–11.0)

## 2014-12-12 MED ORDER — LORAZEPAM 2 MG/ML IJ SOLN
2.0000 mg | Freq: Once | INTRAMUSCULAR | Status: AC
  Administered 2014-12-12: 2 mg via INTRAVENOUS
  Filled 2014-12-12: qty 1

## 2014-12-12 MED ORDER — QUETIAPINE FUMARATE 25 MG PO TABS
25.0000 mg | ORAL_TABLET | Freq: Two times a day (BID) | ORAL | Status: DC
  Administered 2014-12-12 – 2014-12-14 (×5): 25 mg via ORAL
  Filled 2014-12-12 (×5): qty 1

## 2014-12-12 NOTE — Progress Notes (Signed)
   12/12/14 1100  Clinical Encounter Type  Visited With Patient  Visit Type Initial  Referral From Nurse  Tigard visited patient. Patient appeared not to understand the engagement. Chaplain offered support. Patient responded to Chaplain with incomplete expressions. Chaplain engaged assigned nurse for further update.

## 2014-12-12 NOTE — Progress Notes (Signed)
[facility] [place] NAME: Tina Holden    MR#:  [id number]  DATE OF BIRTH:  06-Jun-1950  SUBJECTIVE:  CHIEF COMPLAINT:   Chief Complaint  Patient presents with  . Seizures   patient is 64 year old African-American female with history of seizure disorder who presents to the hospital after seizure. Her mentation remains quite poor. She is not able to provide review of systems unfortunately, she has intermittent episodes of moaning and crying, screaming, but then she is able to intermittently answer questions, some of them appropriately.   Review of Systems  Unable to perform ROS: medical condition    VITAL SIGNS: Blood pressure 131/75, pulse 77, temperature 98.7 F (37.1 C), temperature source Oral, resp. rate 20, height 5\' 6"  (1.676 m), weight 81.647 kg (180 lb), SpO2 99 %.  PHYSICAL EXAMINATION:   GENERAL:  64 y.o.-year-old patient lying in the bed with no acute distress. Lying in bed in screaming and moaning. Then she would stop screaming and moaning and is able to interact and answer questions.  EYES: Pupils equal, round, reactive to light and accommodation. No scleral icterus. Extraocular muscles intact.  HEENT: Head atraumatic, normocephalic. Oropharynx and nasopharynx clear.  NECK:  Supple, no jugular venous distention. No thyroid enlargement, no tenderness.  LUNGS: Somewhat diminished breath sounds bilaterally, poor effort, no wheezing, rales,rhonchi or crepitation. No use of accessory muscles of respiration.  CARDIOVASCULAR: S1, S2 normal. No murmurs, rubs, or gallops.  ABDOMEN: Soft, nontender, nondistended. Bowel sounds present. No organomegaly or mass.  EXTREMITIES: No pedal edema, cyanosis, or clubbing.  NEUROLOGIC: Cranial nerves II through XII are intact. Muscle strength 5/5 in all extremities. Sensation intact. Gait not checked.  PSYCHIATRIC: The patient is alert , not oriented.  SKIN: No obvious rash, lesion, or  ulcer.   ORDERS/RESULTS REVIEWED:   CBC  Recent Labs Lab 12/11/14 1022 12/12/14 0444  WBC 10.5 13.4*  HGB 15.6 13.7  HCT 45.7 40.7  PLT 254 216  MCV 87.1 88.1  MCH 29.6 29.7  MCHC 34.0 33.7  RDW 13.2 13.4   ------------------------------------------------------------------------------------------------------------------  Chemistries   Recent Labs Lab 12/11/14 1022 12/12/14 0444  NA 139 138  K 3.9 3.5  CL 105 109  CO2 27 21*  GLUCOSE 148* 130*  BUN 14 10  CREATININE 1.07* 0.73  CALCIUM 9.7 8.4*  AST 24  --   ALT 20  --   ALKPHOS 85  --   BILITOT 1.1  --    ------------------------------------------------------------------------------------------------------------------ estimated creatinine clearance is 75.5 mL/min (by C-G formula based on Cr of 0.73). ------------------------------------------------------------------------------------------------------------------ No results for input(s): TSH, T4TOTAL, T3FREE, THYROIDAB in the last 72 hours.  Invalid input(s): FREET3  Cardiac Enzymes  Recent Labs Lab 12/11/14 1022  TROPONINI <0.03   ------------------------------------------------------------------------------------------------------------------ Invalid input(s): POCBNP ---------------------------------------------------------------------------------------------------------------  RADIOLOGY: Ct Head Wo Contrast  12/11/2014   CLINICAL DATA:  Altered mental status today. History of seizures. Status post fall.  EXAM: CT HEAD WITHOUT CONTRAST  TECHNIQUE: Contiguous axial images were obtained from the base of the skull through the vertex without intravenous contrast.  COMPARISON:  Head CT scan 02/09/2013.  FINDINGS: There is no evidence of acute intracranial abnormality including hemorrhage, infarct, mass lesion, mass effect, midline shift or abnormal extra-axial fluid collection. No hydrocephalus or pneumocephalus. The calvarium is intact.  IMPRESSION: Negative  exam.   Electronically Signed   By: Inge Rise M.D.   On: 12/11/2014 11:08   Dg Chest Portable 1 View  12/11/2014  CLINICAL DATA:  Lethargy today.  History of seizures.  EXAM: PORTABLE CHEST - 1 VIEW  COMPARISON:  Single view of the chest 02/09/2013.  FINDINGS: The patient is rotated on the exam. Lung volumes are low but the lungs are clear. Heart size is normal. No pneumothorax or pleural effusion.  IMPRESSION: No acute finding in a low volume chest.   Electronically Signed   By: Inge Rise M.D.   On: 12/11/2014 11:30    EKG:  Orders placed or performed during the hospital encounter of 12/11/14  . EKG 12-Lead  . EKG 12-Lead  . ED EKG  . ED EKG  . EKG 12-Lead  . EKG 12-Lead    ASSESSMENT AND PLAN:  Active Problems:   Seizure 1. Seizure, patient was seen by neurologist. She is to continue [place] IV 2. Metabolic encephalopathy, likely delirium after seizure and due to urinary tract infection, follow clinically. Initiate patient on Seroquel 25 g twice daily dose per neurologist recommendations 3. Urinary tract infection due to Escherichia coli, susceptibilities to follow. Patient is to continue Rocephin intravenously for now, adjust antibiotic depending on culture results 4. Leukocytosis. Follow with therapy 5. Renal insufficiency, improved with IV fluids   Management plans discussed with the patient, family and they are in agreement.   DRUG ALLERGIES:  Allergies  Allergen Reactions  . Asa [Aspirin] Other (See Comments)    Unknown   . Chocolate Flavor   . Codeine Other (See Comments)    unknown  . Dilantin [Phenytoin Sodium Extended]     CODE STATUS:     Code Status Orders        Start     Ordered   12/11/14 1448  Full code   Continuous     12/11/14 1447      TOTAL TIME TAKING CARE OF THIS PATIENT: 40 minutes.  Discussed extensively with patient's brother as well as neurologist  Tommy Goostree M.D on 12/12/2014 at 12:37 PM  Between 7am to 6pm -  Pager - [phone]  After 6pm go to [email or web address] - password [email or web address] [facility]  [place] [facility]  Office  [phone]  CC: Primary care physician; No primary care provider on file.

## 2014-12-12 NOTE — Consult Note (Signed)
CC: seizure   HPI: Tina Holden is an 64 y.o. female female with a known history of seizure disorder, diabetes and arthritis. The patient was sent from facility to the ED due to seizure activity yesterday. Last seizure prior was about 2 yrs ago as per sister.  Pt is still agitated with outbursts of yelling.  On Keppra and group home.  Now likely delirium.    Past Medical History  Diagnosis Date  . Seizures   . Diabetes mellitus without complication   . Arthritis     History reviewed. No pertinent past surgical history.  History reviewed. No pertinent family history.  Social History:  reports that she has never smoked. She does not have any smokeless tobacco history on file. She reports that she does not drink alcohol or use illicit drugs.  Allergies  Allergen Reactions  . Asa [Aspirin] Other (See Comments)    Unknown   . Chocolate Flavor   . Codeine Other (See Comments)    unknown  . Dilantin [Phenytoin Sodium Extended]     Medications: I have reviewed the patient's current medications.  ROS: Unable to obtain due to confusion/disorientation   Physical Examination: Blood pressure 131/75, pulse 77, temperature 98.7 F (37.1 C), temperature source Oral, resp. rate 20, height 5\' 6"  (1.676 m), weight 81.647 kg (180 lb), SpO2 99 %.  Follows commands Tells me date, time location EOM intact Facial sensation intact Strength 4+/5 b/l Sensation intact.   Laboratory Studies:   Basic Metabolic Panel:  Recent Labs Lab 12/11/14 1022 12/12/14 0444  NA 139 138  K 3.9 3.5  CL 105 109  CO2 27 21*  GLUCOSE 148* 130*  BUN 14 10  CREATININE 1.07* 0.73  CALCIUM 9.7 8.4*    Liver Function Tests:  Recent Labs Lab 12/11/14 1022  AST 24  ALT 20  ALKPHOS 85  BILITOT 1.1  PROT 8.1  ALBUMIN 5.0   No results for input(s): LIPASE, AMYLASE in the last 168 hours. No results for input(s): AMMONIA in the last 168 hours.  CBC:  Recent Labs Lab 12/11/14 1022  12/12/14 0444  WBC 10.5 13.4*  HGB 15.6 13.7  HCT 45.7 40.7  MCV 87.1 88.1  PLT 254 216    Cardiac Enzymes:  Recent Labs Lab 12/11/14 1022  TROPONINI <0.03    BNP: Invalid input(s): POCBNP  CBG:  Recent Labs Lab 12/11/14 1020  GLUCAP 145*    Microbiology: Results for orders placed or performed during the hospital encounter of 12/11/14  Urine culture     Status: None (Preliminary result)   Collection Time: 12/11/14 11:46 AM  Result Value Ref Range Status   Specimen Description URINE, CATHETERIZED  Final   Special Requests NONE  Final   Culture   Final    >=100,000 COLONIES/mL ESCHERICHIA COLI SUSCEPTIBILITIES TO FOLLOW    Report Status PENDING  Incomplete    Coagulation Studies: No results for input(s): LABPROT, INR in the last 72 hours.  Urinalysis:  Recent Labs Lab 12/11/14 1146  COLORURINE YELLOW*  LABSPEC 1.017  PHURINE 6.0  GLUCOSEU NEGATIVE  HGBUR NEGATIVE  BILIRUBINUR NEGATIVE  KETONESUR TRACE*  PROTEINUR NEGATIVE  NITRITE NEGATIVE  LEUKOCYTESUR 3+*    Lipid Panel:  No results found for: CHOL, TRIG, HDL, CHOLHDL, VLDL, LDLCALC  HgbA1C: No results found for: HGBA1C  Urine Drug Screen:     Component Value Date/Time   LABOPIA NEGATIVE 08/30/2013 0040   LABBENZ NEGATIVE 08/30/2013 0040   AMPHETMU NEGATIVE 08/30/2013 0040  THCU NEGATIVE 08/30/2013 0040   LABBARB NEGATIVE 08/30/2013 0040    Alcohol Level: No results for input(s): ETH in the last 168 hours.  Other results: EKG: normal EKG, normal sinus rhythm, unchanged from previous tracings.  Imaging: Ct Head Wo Contrast  12/11/2014   CLINICAL DATA:  Altered mental status today. History of seizures. Status post fall.  EXAM: CT HEAD WITHOUT CONTRAST  TECHNIQUE: Contiguous axial images were obtained from the base of the skull through the vertex without intravenous contrast.  COMPARISON:  Head CT scan 02/09/2013.  FINDINGS: There is no evidence of acute intracranial abnormality including  hemorrhage, infarct, mass lesion, mass effect, midline shift or abnormal extra-axial fluid collection. No hydrocephalus or pneumocephalus. The calvarium is intact.  IMPRESSION: Negative exam.   Electronically Signed   By: Inge Rise M.D.   On: 12/11/2014 11:08   Dg Chest Portable 1 View  12/11/2014   CLINICAL DATA:  Lethargy today.  History of seizures.  EXAM: PORTABLE CHEST - 1 VIEW  COMPARISON:  Single view of the chest 02/09/2013.  FINDINGS: The patient is rotated on the exam. Lung volumes are low but the lungs are clear. Heart size is normal. No pneumothorax or pleural effusion.  IMPRESSION: No acute finding in a low volume chest.   Electronically Signed   By: Inge Rise M.D.   On: 12/11/2014 11:30     Assessment/Plan:   64 y.o. female female with a known history of seizure disorder, diabetes and arthritis. The patient was sent from facility to the ED due to seizure activity yesterday. Last seizure prior was about 2 yrs ago as per sister.  Pt is still agitated with outbursts of yelling.  On Keppra and group home.  Now likely delirium.   - seizure likely in the setting of UTI which lowered seizure threshold - Pt is on Keppra, not sure great long term medication due to psychiatric co morbidities. Will continue at this point - On rocephin  - seroquel 25 BID for acute hyperactive delirium - d/w sister at bedside Leotis Pain   12/12/2014, 12:58 PM

## 2014-12-12 NOTE — Progress Notes (Signed)
MD notified of bladder scan results and 0 output post I & O cath. Orders to continue to monitor output and repeat bladder scan in am if no output.

## 2014-12-12 NOTE — Evaluation (Signed)
Clinical/Bedside Swallow Evaluation Patient Details  Name: Tina Holden MRN: [id number] Date of Birth: Aug 21, 1950  Today's Date: 12/12/2014 Time: SLP Start Time (ACUTE ONLY): 1430 SLP Stop Time (ACUTE ONLY): 1530 SLP Time Calculation (min) (ACUTE ONLY): 60 min  Past Medical History:  Past Medical History  Diagnosis Date  . Seizures   . Diabetes mellitus without complication   . Arthritis    Past Surgical History: History reviewed. No pertinent past surgical history. HPI:  Tina Holden is a 64 y.o. 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 According to staff at the family care home where she lives, she was found in bed incontinent of urine and confused. When they tried to walk her, she reported feeling dizzy. No recent illness. No falls. Patient has no pain complaints at this time. She has had no witnessed GTC seizure.   Assessment / Plan / Recommendation Clinical Impression  Pt presents with oral phase dysphagia c/b poor awareness of bolus, min oral holding, poor labial seal (initially) and searching behaviors. Pt does not show overt s/s of aspiration or pharyngeal phase dysphagia. Pt has a known history of schizophrenia. Mental status impaired during visit c/b by yelling, unintelligible speech, lack of awarenss of task, extreme agitation, and attempts to speak when straw present in mouth (latter indiciative of poor oral awarenss also evidenced by bolus management in PO tirals). Pt would benefit from modified diet of puree consistencies with thin liquids when following strict aspiration precautions.  NSG updated. Meds crushed in puree as able. Pt needs extensive cueing to remain on task; reassurance and reorientation to task also necessary.    Aspiration Risk  Mild (- moderate based on impaired cognitive  status)    Diet Recommendation Dysphagia 1 (Puree);Thin   Medication Administration: Crushed with puree (as able) Compensations: Slow rate;Small sips/bites;Follow solids with liquid;Use straw to facilitate chin tuck;Check for pocketing (check or oral holding/residue before feeding next bite)    Other  Recommendations Oral Care Recommendations: Oral care BID;Staff/trained caregiver to provide oral care   Follow Up Recommendations       Frequency and Duration min 2x/week  1 week   Pertinent Vitals/Pain None reported    SLP Swallow Goals   See plan of care.   Swallow Study Prior Functional Status   Pt has known hx of schizophrenia and seizure disorder; was on regular diet prior to admission.    General Date of Onset: 12/11/14 Other Pertinent Information: Tina Holden is a 64 y.o. 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 According to staff at the family care home where she lives, she was found in bed incontinent of urine and confused. When they tried to walk her, she reported feeling dizzy. No recent illness. No falls. Patient has no pain complaints at this time. She has had no witnessed GTC seizure. Type of Study: Bedside swallow evaluation Diet Prior to this Study: Regular;Thin liquids Temperature Spikes Noted: No (WBC elevated) Respiratory Status: Room air History of Recent Intubation: No Behavior/Cognition: Alert;Confused;Agitated;Distractible;Requires cueing (Pt quite agitated and requires redirection/reassurance) Oral Cavity - Dentition: Missing dentition Self-Feeding Abilities: Needs set up;Total assist Patient Positioning: Upright in bed Baseline Vocal Quality:  (adaquate vocal intensity throughout ST visit) Volitional Cough:  (NT) Volitional Swallow:  Able to elicit    Oral/Motor/Sensory Function  Overall Oral Motor/Sensory Function: Appears within functional limits for tasks assessed Labial ROM:  (NT, but appears WFL) Labial Symmetry: Within Functional Limits Labial Strength:  (NT) Labial Sensation:  (NT) Lingual ROM:  (NT, but appears WFL in bolus managment) Lingual Symmetry:  (NT) Lingual Strength:  (NT) Lingual Sensation:  (NT) Facial ROM:  (NT) Facial Symmetry: Within Functional Limits Facial Strength:  (NT) Facial Sensation:  (NT) Velum:  (NT) Mandible: Within Functional Limits (as observed in bolus managment)   Ice Chips Ice chips: Not tested   Thin Liquid Thin Liquid: Impaired Presentation: Straw Oral Phase Impairments: Reduced labial seal;Poor awareness of bolus Other Comments: Pt appeared to be searching for straw when sipping both water and apple juice. She continuted to try and speak (and yell) when straw in mouth. However, no overt s/s of aspiration were noted on trials of thin liquids. Suspect oral phase deficit and poor awareness tied to mental status (i.e., impaired cognitive status). Pt consumed approx. 2 ounces of thin liquids total.    Nectar Thick Nectar Thick Liquid: Not tested   Honey Thick Honey Thick Liquid: Not tested   Puree Puree: Impaired Presentation: Spoon Oral Phase Impairments: Reduced labial seal;Poor awareness of bolus Oral Phase Functional Implications: Oral residue;Oral holding Other Comments: Pt continued to demonstrate poor awareness of bolus and would occaisonally have residue remaining on back of tongue while searching for next bite. Poor labial seal around spoon initially, however became aware of spoon and task upon clinician prompting/cuing and cleared bolus.  Extensive verbal. visual and tactile cues necessary to complete task.   Solid   GO    Solid: Not tested       Meghan Esper 12/12/2014,3:41 PM

## 2014-12-12 NOTE — Clinical Social Work Note (Signed)
Clinical Social Work Assessment  Patient Details  Name: Tina Holden MRN: [id number] Date of Birth: Jul 01, 1950  Date of referral:  12/12/14               Reason for consult:  Facility Placement                Permission sought to share information with:    Permission granted to share information::     Name::        Agency::     Relationship::     Contact Information:     Housing/Transportation Living arrangements for the past 2 months:  Group Home Source of Information:   (brother) Patient Interpreter Needed:  None Criminal Activity/Legal Involvement Pertinent to Current Situation/Hospitalization:  No - Comment as needed Significant Relationships:  Siblings Lives with:    Do you feel safe going back to the place where you live?    Need for family participation in patient care:     Care giving concerns:  Patient resides at [facility] Worker assessment / plan:  Patient only able to provide one word answers and repeats those answers. CSW met with patient's brother as he was leaving patient's room this morning. Patient's brother stated patient has been at [facility] group home for 2 years. He stated patient is not at baseline and has not had yelling episodes like she is having now. He stated that patient works 3 days a week at [employer] and has for several years. FL2 on chart. CSW will continue to follow as it is uncertain if patient will return to baseline and may need a higher level of care.   Employment status:  Disabled (Comment on whether or not currently receiving Disability) Insurance information:  Medicaid In [place] PT Recommendations:  Not assessed at this time Information / Referral to community resources:     Patient/Family's Response to care:  Patient's brother expressed appreciation for CSW visit.  Patient/Family's Understanding of and Emotional Response to Diagnosis, Current Treatment, and Prognosis:   Patient's brother is concerned as  patient's behavior is not her norm. Emotional Assessment Appearance:  Appears older than stated age Attitude/Demeanor/Rapport:   (Intermittent Yelling ) Affect (typically observed):    Orientation:  Fluctuating Orientation (Suspected and/or reported Sundowners) Alcohol / Substance use:  Not Applicable Psych involvement (Current and /or in the community):     Discharge Needs  Concerns to be addressed:  Discharge Planning Concerns Readmission within the last 30 days:  No Current discharge risk:  None Barriers to Discharge:  No Barriers Identified   Shela Leff, LCSW 12/12/2014, 11:12 AM

## 2014-12-13 DIAGNOSIS — F89 Unspecified disorder of psychological development: Secondary | ICD-10-CM

## 2014-12-13 DIAGNOSIS — F209 Schizophrenia, unspecified: Secondary | ICD-10-CM

## 2014-12-13 DIAGNOSIS — R410 Disorientation, unspecified: Secondary | ICD-10-CM

## 2014-12-13 LAB — URINE CULTURE: Culture: >=100000

## 2014-12-13 LAB — CBC
HEMATOCRIT: 40.3 % (ref 35.0–47.0)
HEMOGLOBIN: 13.5 g/dL (ref 12.0–16.0)
MCH: 30.2 pg (ref 26.0–34.0)
MCHC: 33.6 g/dL (ref 32.0–36.0)
MCV: 89.6 fL (ref 80.0–100.0)
Platelets: 214 10*3/uL (ref 150–440)
RBC: 4.49 MIL/uL (ref 3.80–5.20)
RDW: 13.4 % (ref 11.5–14.5)
WBC: 8.6 10*3/uL (ref 3.6–11.0)

## 2014-12-13 LAB — MAGNESIUM: Magnesium: 2.3 mg/dL (ref 1.7–2.4)

## 2014-12-13 MED ORDER — CEPHALEXIN 250 MG PO CAPS
250.0000 mg | ORAL_CAPSULE | Freq: Three times a day (TID) | ORAL | Status: DC
  Administered 2014-12-13 – 2014-12-14 (×3): 250 mg via ORAL
  Filled 2014-12-13 (×3): qty 1

## 2014-12-13 MED ORDER — HALOPERIDOL LACTATE 5 MG/ML IJ SOLN: 2.0000 mg | INTRAMUSCULAR | Status: DC | PRN

## 2014-12-13 MED ORDER — LEVETIRACETAM 500 MG PO TABS
500.0000 mg | ORAL_TABLET | Freq: Two times a day (BID) | ORAL | Status: DC
  Administered 2014-12-13 – 2014-12-14 (×3): 500 mg via ORAL
  Filled 2014-12-13 (×3): qty 1

## 2014-12-13 NOTE — Progress Notes (Signed)
Late entry of charges by this clinician.  Orinda Kenner, [place], CCC-SLP

## 2014-12-13 NOTE — Progress Notes (Signed)
[facility] [place] NAME: Tina Holden    MR#:  [id number]  DATE OF BIRTH:  [birth year]  SUBJECTIVE:  CHIEF COMPLAINT:   Chief Complaint  Patient presents with  . Seizures   patient is 64 year old African-American female with history of seizure disorder who presents to the hospital after seizure. Her mentation remains quite poor. She is not able to provide review of systems unfortunately, she has intermittent episodes of crying, screaming, but then she is able to intermittently answer questions, some of them appropriately. Remains agitated and climbs out of bed at nighttime. Despite the initiation on Seroquel. EKG reveals QTC prolonged to 470 ms up from 430 ms. . Denies any pains or discomfort  Review of Systems  Unable to perform ROS: medical condition    VITAL SIGNS: Blood pressure 132/60, pulse 68, temperature 98.2 F (36.8 C), temperature source Oral, resp. rate 16, height 5\' 6"  (1.676 m), weight 81.647 kg (180 lb), SpO2 96 %.  PHYSICAL EXAMINATION:   GENERAL:  64 y.o.-year-old patient lying in the bed with no acute distress. Sitting in the bed eating breakfast, comfortable  EYES: Pupils equal, round, reactive to light and accommodation. No scleral icterus. Extraocular muscles intact.  HEENT: Head atraumatic, normocephalic. Oropharynx and nasopharynx clear.  NECK:  Supple, no jugular venous distention. No thyroid enlargement, no tenderness.  LUNGS: Somewhat diminished breath sounds bilaterally, poor effort, no wheezing, rales,rhonchi or crepitation. No use of accessory muscles of respiration.  CARDIOVASCULAR: S1, S2 normal. No murmurs, rubs, or gallops.  ABDOMEN: Soft, nontender, nondistended. Bowel sounds present. No organomegaly or mass.  EXTREMITIES: No pedal edema, cyanosis, or clubbing.  NEUROLOGIC: Cranial nerves II through XII are intact. Muscle strength 5/5 in all extremities. Sensation intact. Gait not checked.   PSYCHIATRIC: The patient is alert , not oriented.  SKIN: No obvious rash, lesion, or ulcer.   ORDERS/RESULTS REVIEWED:   CBC  Recent Labs Lab 12/11/14 1022 12/12/14 0444 12/13/14 0443  WBC 10.5 13.4* 8.6  HGB 15.6 13.7 13.5  HCT 45.7 40.7 40.3  PLT 254 216 214  MCV 87.1 88.1 89.6  MCH 29.6 29.7 30.2  MCHC 34.0 33.7 33.6  RDW 13.2 13.4 13.4   ------------------------------------------------------------------------------------------------------------------  Chemistries   Recent Labs Lab 12/11/14 1022 12/12/14 0444 12/13/14 0443  NA 139 138  --   K 3.9 3.5  --   CL 105 109  --   CO2 27 21*  --   GLUCOSE 148* 130*  --   BUN 14 10  --   CREATININE 1.07* 0.73  --   CALCIUM 9.7 8.4*  --   MG  --   --  2.3  AST 24  --   --   ALT 20  --   --   ALKPHOS 85  --   --   BILITOT 1.1  --   --    ------------------------------------------------------------------------------------------------------------------ estimated creatinine clearance is 75.5 mL/min (by C-G formula based on Cr of 0.73). ------------------------------------------------------------------------------------------------------------------ No results for input(s): TSH, T4TOTAL, T3FREE, THYROIDAB in the last 72 hours.  Invalid input(s): FREET3  Cardiac Enzymes  Recent Labs Lab 12/11/14 1022  TROPONINI <0.03   ------------------------------------------------------------------------------------------------------------------ Invalid input(s): POCBNP ---------------------------------------------------------------------------------------------------------------  RADIOLOGY: No results found.  EKG:  Orders placed or performed during the hospital encounter of 12/11/14  . EKG 12-Lead  . EKG 12-Lead  . ED EKG  . ED EKG  . EKG 12-Lead  . EKG 12-Lead    ASSESSMENT AND  PLAN:  Active Problems:   Seizure 1. Seizure, patient was seen by neurologist. She is to continue Keppra , changed to oral 2. Metabolic  encephalopathy, likely delirium after seizure and due to urinary tract infection, improving clinically. Continue patient on Seroquel 25 g twice daily dose per neurologist recommendations, add Haldol as needed, following patient's QTC closely. Normal magnesium level.  3. Urinary tract infection due to Escherichia coli, pansensitive. Discontinue Rocephin, initiate patient on Keflex orally 4. Leukocytosis. Resolved with antibiotic therapy 5. Renal insufficiency, improved with IV fluids 6. Generalized weakness, initiate physical therapy, although as discussed with care management, the patient will likely be able to return back to family care home with home health if needed   Management plans discussed with the patient, family and they are in agreement.   DRUG ALLERGIES:  Allergies  Allergen Reactions  . Asa [Aspirin] Other (See Comments)    Unknown   . Chocolate Flavor   . Codeine Other (See Comments)    unknown  . Dilantin [Phenytoin Sodium Extended]     CODE STATUS:     Code Status Orders        Start     Ordered   12/11/14 1448  Full code   Continuous     12/11/14 1447      TOTAL TIME TAKING CARE OF THIS PATIENT: 40 minutes.  Theodoro Grist M.D on 12/13/2014 at 1:27 PM  Between 7am to 6pm - Pager - [phone]  After 6pm go to [email or web address] - password [email or web address]  [facility]  Office  [phone]  CC: Primary care physician; No primary care provider on file.

## 2014-12-13 NOTE — Consult Note (Signed)
[facility] Consult   Reason for Consult:  Consult for this 64 year old woman with a history of schizophrenia who comes into the hospital after having a seizure Referring Physician:  Paulene Floor Patient Identification: Tina Holden MRN:  [id number] Principal Diagnosis: Acute delirium Diagnosis:   Patient Active Problem List   Diagnosis Date Noted  . Schizophrenia [F20.9] 12/13/2014  . Acute delirium [R41.0] 12/13/2014  . Developmental disability [F89] 12/13/2014  . Seizure [R56.9] 12/11/2014    Total Time spent with patient: 45 minutes  Subjective:   Tina Holden is a 64 y.o. female patient admitted with "I had a seizure".  HPI:  Information from the patient and the chart. This 64 year old woman with schizophrenia and a known history of seizure disorder came into the hospital after having had a presumed seizure at her group home. As far as I can find in the chart there hasn't been any specific stress that would account for a reason for a new seizure but she does have a past history of seizure disorder. The patient herself is aware that she had a seizure but can't give me any details about it. Her history is limited right now but she denies that she's been having any mood problems. Denies that she's having any hallucinations. Denies suicidal or homicidal thoughts. Denies any pain or other physical symptoms right now. No evidence or reason to suspect alcohol or drug abuse.  Past psychiatric history: Long-standing probably lifelong mental illness with a diagnosis of schizophrenia. Also very likely developmental disability based on interaction style and cognitive level. Has been living in group homes for a long time. She has had psychiatric hospitalizations in the past but she can't remember how long ago. She denies any history of suicide attempts but we don't have complete history about that. She has been maintained on thiothixene as her primary psychiatric medicine  for at least several years.  Social history: Resides in a group home. She tells me that she had 6 siblings. One sister appears to be closest to her and involved in her care and assisting with decision making. I'm not able to tell from our interview whether the patient actually has children or not.  Medical history: Past history of seizure disorder of unknown cause. Also history of gastric reflux symptoms and vitamin B12 deficiency  Family history: None known.  Substance abuse history: Patient denies ever having had an alcohol or drug problem and is clearly not involved right now.  Current medication: The thiothixene dose quoted on admission is 5 mg at night. I believe she used to be on 10 mg at night. Not clear to me whether this was an intentional change. Also on gabapentin and trazodone. Didn't appear to be on regular antiseizure medicine. I did note previously that she has a history of having had a bad reaction to Dilantin   HPI Elements:   Quality:  Confusion which seems like it may be a little bit better than on first presentation. Severity:  Moderate. Timing:  Most likely just since having had a seizure. Duration:  From what I can piece together it looks like things are getting better. Context:  Chronic seizure disorder.  Past Medical History:  Past Medical History  Diagnosis Date  . Seizures   . Diabetes mellitus without complication   . Arthritis    History reviewed. No pertinent past surgical history. Family History: History reviewed. No pertinent family history. Social History:  History  Alcohol Use No     History  Drug Use No    Social History   Social History  . Marital Status: Single    Spouse Name: N/A  . Number of Children: N/A  . Years of Education: N/A   Social History Main Topics  . Smoking status: Never Smoker   . Smokeless tobacco: None  . Alcohol Use: No  . Drug Use: No  . Sexual Activity: Not Asked   Other Topics Concern  . None   Social  History Narrative  . None   Additional Social History:                          Allergies:   Allergies  Allergen Reactions  . Asa [Aspirin] Other (See Comments)    Unknown   . Chocolate Flavor   . Codeine Other (See Comments)    unknown  . Dilantin [Phenytoin Sodium Extended]     Labs:  Results for orders placed or performed during the hospital encounter of 12/11/14 (from the past 48 hour(s))  Basic metabolic panel     Status: Abnormal   Collection Time: 12/12/14  4:44 AM  Result Value Ref Range   Sodium 138 135 - 145 mmol/L   Potassium 3.5 3.5 - 5.1 mmol/L   Chloride 109 101 - 111 mmol/L   CO2 21 (L) 22 - 32 mmol/L   Glucose, Bld 130 (H) 65 - 99 mg/dL   BUN 10 6 - 20 mg/dL   Creatinine, Ser 0.73 0.44 - 1.00 mg/dL   Calcium 8.4 (L) 8.9 - 10.3 mg/dL   GFR calc non Af Amer >60 >60 mL/min   GFR calc Af Amer >60 >60 mL/min    Comment: (NOTE) The eGFR has been calculated using the CKD EPI equation. This calculation has not been validated in all clinical situations. eGFR's persistently <60 mL/min signify possible Chronic Kidney Disease.    Anion gap 8 5 - 15  CBC     Status: Abnormal   Collection Time: 12/12/14  4:44 AM  Result Value Ref Range   WBC 13.4 (H) 3.6 - 11.0 K/uL   RBC 4.63 3.80 - 5.20 MIL/uL   Hemoglobin 13.7 12.0 - 16.0 g/dL   HCT 40.7 35.0 - 47.0 %   MCV 88.1 80.0 - 100.0 fL   MCH 29.7 26.0 - 34.0 pg   MCHC 33.7 32.0 - 36.0 g/dL   RDW 13.4 11.5 - 14.5 %   Platelets 216 150 - 440 K/uL  CBC     Status: None   Collection Time: 12/13/14  4:43 AM  Result Value Ref Range   WBC 8.6 3.6 - 11.0 K/uL   RBC 4.49 3.80 - 5.20 MIL/uL   Hemoglobin 13.5 12.0 - 16.0 g/dL   HCT 40.3 35.0 - 47.0 %   MCV 89.6 80.0 - 100.0 fL   MCH 30.2 26.0 - 34.0 pg   MCHC 33.6 32.0 - 36.0 g/dL   RDW 13.4 11.5 - 14.5 %   Platelets 214 150 - 440 K/uL  Magnesium     Status: None   Collection Time: 12/13/14  4:43 AM  Result Value Ref Range   Magnesium 2.3 1.7 - 2.4 mg/dL     Vitals: Blood pressure 132/60, pulse 68, temperature 98.2 F (36.8 C), temperature source Oral, resp. rate 16, height '5\' 6"'  (1.676 m), weight 81.647 kg (180 lb), SpO2 96 %.  Risk to Self: Is patient at risk for suicide?: No Risk to Others:   Prior  Inpatient Therapy:   Prior Outpatient Therapy:    Current Facility-Administered Medications  Medication Dose Route Frequency Provider Last Rate Last Dose  . acetaminophen (TYLENOL) tablet 650 mg  650 mg Oral Q6H PRN Demetrios Loll, MD       Or  . acetaminophen (TYLENOL) suppository 650 mg  650 mg Rectal Q6H PRN Demetrios Loll, MD      . albuterol (PROVENTIL) (2.5 MG/3ML) 0.083% nebulizer solution 2.5 mg  2.5 mg Nebulization Q2H PRN Demetrios Loll, MD      . benztropine (COGENTIN) tablet 1 mg  1 mg Oral BID Demetrios Loll, MD   1 mg at 12/13/14 1008  . cephALEXin (KEFLEX) capsule 250 mg  250 mg Oral 3 times per day Theodoro Grist, MD   250 mg at 12/13/14 1634  . famotidine (PEPCID) tablet 20 mg  20 mg Oral Daily Demetrios Loll, MD   20 mg at 12/13/14 1008  . gabapentin (NEURONTIN) capsule 300 mg  300 mg Oral BID Demetrios Loll, MD   300 mg at 12/13/14 1008  . haloperidol lactate (HALDOL) injection 2 mg  2 mg Intravenous Q4H PRN Theodoro Grist, MD      . heparin injection 5,000 Units  5,000 Units Subcutaneous 3 times per day Demetrios Loll, MD   5,000 Units at 12/13/14 1633  . levETIRAcetam (KEPPRA) tablet 500 mg  500 mg Oral BID Theodoro Grist, MD   500 mg at 12/13/14 1634  . ondansetron (ZOFRAN) tablet 4 mg  4 mg Oral Q6H PRN Demetrios Loll, MD       Or  . ondansetron [facility]) injection 4 mg  4 mg Intravenous Q6H PRN Demetrios Loll, MD      . polyethylene glycol (MIRALAX / GLYCOLAX) packet 17 g  17 g Oral Daily PRN Demetrios Loll, MD      . QUEtiapine (SEROQUEL) tablet 25 mg  25 mg Oral BID Theodoro Grist, MD   25 mg at 12/13/14 1008  . thiothixene (NAVANE) capsule 5 mg  5 mg Oral QHS Demetrios Loll, MD   5 mg at 12/12/14 2127  . traZODone (DESYREL) tablet 100 mg  100 mg Oral QHS Demetrios Loll, MD    100 mg at 12/12/14 2127    Musculoskeletal: Strength & Muscle Tone: within normal limits Gait & Station: Did not ask her to stand but I believe she can stand normally Patient leans: N/A  Psychiatric Specialty Exam: Physical Exam  Nursing note and vitals reviewed. Constitutional: She appears well-developed and well-nourished.  HENT:  Head: Normocephalic and atraumatic.  Eyes: Conjunctivae are normal. Pupils are equal, round, and reactive to light.  Neck: Normal range of motion.  Cardiovascular: Normal heart sounds.   Respiratory: Effort normal.  GI: Soft.  Musculoskeletal: Normal range of motion.  Neurological: She is alert.  Skin: Skin is warm and dry.  Psychiatric: Her affect is blunt. Her speech is delayed and slurred. She is slowed. Thought content is not paranoid and not delusional. Cognition and memory are impaired. She does not express impulsivity or inappropriate judgment. She expresses no homicidal and no suicidal ideation. She expresses no suicidal plans and no homicidal plans. She exhibits abnormal recent memory and abnormal remote memory. She is inattentive.    Review of Systems  Constitutional: Negative.   HENT: Negative.   Eyes: Negative.   Respiratory: Negative.   Cardiovascular: Negative.   Gastrointestinal: Negative.   Musculoskeletal: Negative.   Skin: Negative.   Neurological: Negative.   Psychiatric/Behavioral: Negative for depression, suicidal ideas,  hallucinations, memory loss and substance abuse. The patient is not nervous/anxious and does not have insomnia.     Blood pressure 132/60, pulse 68, temperature 98.2 F (36.8 C), temperature source Oral, resp. rate 16, height '5\' 6"'  (1.676 m), weight 81.647 kg (180 lb), SpO2 96 %.Body mass index is 29.07 kg/(m^2).  General Appearance: Disheveled  Eye Contact::  Minimal  Speech:  Garbled and Slurred  Volume:  Normal  Mood:  Euthymic  Affect:  Flat  Thought Process:  Loose and Tangential  Orientation:  Other:   Patient know she is in the hospital. She tells me the year is 3. She does know however that she had a seizure  Thought Content:  Negative  Suicidal Thoughts:  No  Homicidal Thoughts:  No  Memory:  Immediate;   Fair Recent;   Poor Remote;   Fair  Judgement:  Fair  Insight:  Fair  Psychomotor Activity:  Normal  Concentration:  Poor  Recall:  Poor  Fund of Knowledge:Poor  Language: Poor  Akathisia:  No  Handed:  Right  AIMS (if indicated):     Assets:  Financial Resources/Insurance Housing Social Support  ADL's:  Intact  Cognition: Impaired,  Moderate  Sleep:      Medical Decision Making: Review of Psycho-Social Stressors (1), Review or order clinical lab tests (1), Review and summation of old records (2), Established Problem, Worsening (2) and Review of Medication Regimen & Side Effects (2)  Treatment Plan Summary: Medication management and Plan This is a 64 year old woman with a history of schizophrenia possible mental retardation or other chronic cognitive impairment and now a seizure. To my interview right now she is a little bit more confused than I remember in the past. Although she can tell me some basic information she also repeats herself a great deal and answers a lot of my questions with total non sequiturs. She was holding a Bible open in front of her but I don't think she was actually reading it. She could not tell me what book it was that she was on. I'm not sure how different this is from her baseline. In any case I suspect that she had acute delirium related to the seizure that is now improving. Probably still a little bit off her baseline. I would continue her not vein at the current dose. Given that she's been on it for years I think the contribution of Navane to her seizure problems is minimal to none. I will come back and check in on her tomorrow. Presumably she will be able to go back to her usual place of living. Supportive counseling completed.  Plan:  Patient  does not meet criteria for psychiatric inpatient admission. Supportive therapy provided about ongoing stressors. Disposition: No change to treatment plan continue current medicine  Alethia Berthold 12/13/2014 5:24 PM

## 2014-12-13 NOTE — Evaluation (Signed)
Physical Therapy Evaluation Patient Details Name: Tina Holden MRN: [id number] DOB: 1950/06/03 Today's Date: 12/13/2014   History of Present Illness  Patient is a 64 y/o female that presents after experiencing an unwitnessed seizure. Patient has a history of schizophrenia and has been living at a [facility] working at [employer] 3 days per week.   Clinical Impression  Patient is a 64 y/o female that presents with significant confusion on this evaluation and it is difficult to determine if this is exacerbated or her baseline. Patient today is extremely impulsive and with RW drifts/staggers laterally multiple times as she begins to take her hands off the walker multiple times. Patient does not overtly lose her balance, but is generally unsafe with ambulation during this evaluation. It is difficult to determine if this is due to her cognitive status or her baseline? Patient will require 24/7 supervision for mobility in her current cognitive state but does not show demonstrable learning ability during this session. Skilled acute PT services are indicated to address her safety and mobility deficits.     Follow Up Recommendations Home health PT    Equipment Recommendations  Rolling walker with 5" wheels    Recommendations for Other Services       Precautions / Restrictions Precautions Precautions: Fall Restrictions Weight Bearing Restrictions: No      Mobility  Bed Mobility Overal bed mobility: Independent             General bed mobility comments: Patient quickly transfers to side of bed with no assistance needed.   Transfers Overall transfer level: Needs assistance Equipment used: Rolling walker (2 wheeled) Transfers: Sit to/from Stand Sit to Stand: Min guard         General transfer comment: Patient requires multiple attempts to stand initially, after sitting in chair after ambulation she was able to stand on first attempt. Patient initially leaning backwards  and requires cuing to lean forwards.   Ambulation/Gait Ambulation/Gait assistance: Min guard Ambulation Distance (Feet): 200 Feet Assistive device: Rolling walker (2 wheeled) Gait Pattern/deviations: Decreased step length - right;Decreased step length - left;Staggering right;Staggering left;Drifts right/left   Gait velocity interpretation: at or above normal speed for age/gender General Gait Details: Patient is extremely unsafe with ambulation bumping into walls, taking her hands off the RW multiple times throughout ambulation and drifting/staggering laterally.   Stairs            Wheelchair Mobility    Modified Rankin (Stroke Patients Only)       Balance Overall balance assessment: Needs assistance Sitting-balance support: Feet unsupported Sitting balance-Leahy Scale: Good Sitting balance - Comments: No loss of balance in sitting noted.    Standing balance support: Bilateral upper extremity supported Standing balance-Leahy Scale: Poor Standing balance comment: Patient is off balance during ambulation as a result of taking her hand off walker repeatedly and veering left/right.                              Pertinent Vitals/Pain Pain Assessment: No/denies pain    Home Living Family/patient expects to be discharged to:: Group home                 Additional Comments: [place] will have 24/7 assistance.     Prior Function Level of Independence: Independent with assistive device(s)         Comments: Very poor historian, it sounds like she has been independent in the community  with a RW     Hand Dominance        Extremity/Trunk Assessment   Upper Extremity Assessment: Overall WFL for tasks assessed           Lower Extremity Assessment: Overall WFL for tasks assessed      Cervical / Trunk Assessment: Normal  Communication   Communication: Expressive difficulties (Slurred speech, difficult to tell if this is baseline or  acute?)  Cognition Arousal/Alertness: Awake/alert Behavior During Therapy: Anxious;Impulsive Overall Cognitive Status: Impaired/Different from baseline Area of Impairment: Attention;Following commands;Safety/judgement;Awareness   Current Attention Level: Divided Memory: Decreased recall of precautions Following Commands: Follows one step commands inconsistently Safety/Judgement: Decreased awareness of safety;Decreased awareness of deficits     General Comments: Patient has extremely poor command following, takes hands off walker repeatedly, runs in to the wall on multiple occassions, and is generally not oriented whatsoever.     General Comments      Exercises        Assessment/Plan    PT Assessment Patient needs continued PT services  PT Diagnosis Difficulty walking;Generalized weakness   PT Problem List Decreased strength;Decreased balance;Decreased knowledge of use of DME;Decreased knowledge of precautions;Decreased safety awareness  PT Treatment Interventions Gait training;DME instruction;Therapeutic activities;Therapeutic exercise;Balance training;Patient/family education   PT Goals (Current goals can be found in the Care Plan section) Acute Rehab PT Goals PT Goal Formulation: Patient unable to participate in goal setting Time For Goal Achievement: 12/27/14 Potential to Achieve Goals: Fair    Frequency Min 2X/week   Barriers to discharge        Co-evaluation               End of Session Equipment Utilized During Treatment: Gait belt Activity Tolerance: Patient tolerated treatment well Patient left: in chair;with call bell/phone within reach;with nursing/sitter in room;with chair alarm set Nurse Communication: Mobility status;Precautions         Time: 9211-9417 PT Time Calculation (min) (ACUTE ONLY): 25 min   Charges:   PT Evaluation $Initial PT Evaluation Tier I: 1 Procedure PT Treatments $Therapeutic Activity: 8-22 mins (Diaper changed multiple  occassions )   PT G Codes:       Kerman Passey, PT, DPT    12/13/2014, 3:38 PM

## 2014-12-13 NOTE — Clinical Social Work Note (Signed)
CSW spoke with owner of [facility]: Elvia: [phone] this morning after she had left patient's room to see if she believed she would be able to take patient back in patient's current state. Elie Confer stated she would take patient back and she requested home health. RN CM notified.  Shela Leff MSW,LCSW [phone]

## 2014-12-13 NOTE — Care Management (Signed)
Patient will need home health upon discharge.  PT evaluation requested. Anticipate discharge back to assisted living. Facility prefers [facility]. Referral placed with Floydene Flock at [facility] in anticipation of discharge orders. PT RN.

## 2014-12-13 NOTE — Progress Notes (Signed)
Speech Therapy Note: reviewed chart notes, consulted NSG re: pt's status today. NSG reported pt was eating and drinking well w/ feeding assistance and encouragement. Pt is calmer today but continues to have episodes of outbursts per report. Rec. continuing w/ current diet at this time w/ aspiration precautions; meds in puree. ST will f/u next 1-3 days in order to assess appropriateness of diet consistency upgrade. NSG agreed.

## 2014-12-14 DIAGNOSIS — N3000 Acute cystitis without hematuria: Secondary | ICD-10-CM | POA: Insufficient documentation

## 2014-12-14 DIAGNOSIS — D72829 Elevated white blood cell count, unspecified: Secondary | ICD-10-CM

## 2014-12-14 MED ORDER — LEVETIRACETAM 500 MG PO TABS: 500.0000 mg | ORAL_TABLET | Freq: Two times a day (BID) | ORAL | Status: DC

## 2014-12-14 MED ORDER — CEPHALEXIN 250 MG PO CAPS: 250.0000 mg | ORAL_CAPSULE | Freq: Three times a day (TID) | ORAL | Status: DC

## 2014-12-14 MED ORDER — QUETIAPINE FUMARATE 25 MG PO TABS: 25.0000 mg | ORAL_TABLET | Freq: Two times a day (BID) | ORAL | Status: DC

## 2014-12-14 NOTE — Consult Note (Signed)
CC: seizure   HPI: Tina Holden is an 64 y.o. female female with a known history of seizure disorder, diabetes and arthritis. The patient was sent from facility to the ED due to seizure activity yesterday. Last seizure prior was about 2 yrs ago as per sister.  Pt is still agitated with outbursts of yelling.  On Keppra and group home.  Now likely delirium.    Past Medical History  Diagnosis Date  . Seizures   . Diabetes mellitus without complication   . Arthritis     History reviewed. No pertinent past surgical history.  History reviewed. No pertinent family history.  Social History:  reports that she has never smoked. She does not have any smokeless tobacco history on file. She reports that she does not drink alcohol or use illicit drugs.  Allergies  Allergen Reactions  . Asa [Aspirin] Other (See Comments)    Unknown   . Chocolate Flavor   . Codeine Other (See Comments)    unknown  . Dilantin [Phenytoin Sodium Extended]     Medications: I have reviewed the patient's current medications.  ROS: Unable to obtain due to confusion/disorientation   Physical Examination: Blood pressure 117/83, pulse 97, temperature 98.4 F (36.9 C), temperature source Oral, resp. rate 14, height 5\' 6"  (1.676 m), weight 81.647 kg (180 lb), SpO2 99 %.  Follows commands Tells me date, time location EOM intact Facial sensation intact Strength 4+/5 b/l Sensation intact.   Laboratory Studies:   Basic Metabolic Panel:  Recent Labs Lab 12/11/14 1022 12/12/14 0444 12/13/14 0443  NA 139 138  --   K 3.9 3.5  --   CL 105 109  --   CO2 27 21*  --   GLUCOSE 148* 130*  --   BUN 14 10  --   CREATININE 1.07* 0.73  --   CALCIUM 9.7 8.4*  --   MG  --   --  2.3    Liver Function Tests:  Recent Labs Lab 12/11/14 1022  AST 24  ALT 20  ALKPHOS 85  BILITOT 1.1  PROT 8.1  ALBUMIN 5.0   No results for input(s): LIPASE, AMYLASE in the last 168 hours. No results for input(s):  AMMONIA in the last 168 hours.  CBC:  Recent Labs Lab 12/11/14 1022 12/12/14 0444 12/13/14 0443  WBC 10.5 13.4* 8.6  HGB 15.6 13.7 13.5  HCT 45.7 40.7 40.3  MCV 87.1 88.1 89.6  PLT 254 216 214    Cardiac Enzymes:  Recent Labs Lab 12/11/14 1022  TROPONINI <0.03    BNP: Invalid input(s): POCBNP  CBG:  Recent Labs Lab 12/11/14 1020  GLUCAP 145*    Microbiology: Results for orders placed or performed during the hospital encounter of 12/11/14  Urine culture     Status: None   Collection Time: 12/11/14 11:46 AM  Result Value Ref Range Status   Specimen Description URINE, CATHETERIZED  Final   Special Requests NONE  Final   Culture >=100,000 COLONIES/mL ESCHERICHIA COLI  Final   Report Status 12/13/2014 FINAL  Final   Organism ID, Bacteria ESCHERICHIA COLI  Final      Susceptibility   Escherichia coli - MIC*    AMPICILLIN <=2 SENSITIVE Sensitive     CEFTAZIDIME <=1 SENSITIVE Sensitive     CEFAZOLIN <=4 SENSITIVE Sensitive     CEFTRIAXONE <=1 SENSITIVE Sensitive     CIPROFLOXACIN <=0.25 SENSITIVE Sensitive     GENTAMICIN <=1 SENSITIVE Sensitive     IMIPENEM <=  0.25 SENSITIVE Sensitive     TRIMETH/SULFA <=20 SENSITIVE Sensitive     NITROFURANTOIN Value in next row Sensitive      SENSITIVE<=16    PIP/TAZO Value in next row Sensitive      SENSITIVE<=4    * >=100,000 COLONIES/mL ESCHERICHIA COLI    Coagulation Studies: No results for input(s): LABPROT, INR in the last 72 hours.  Urinalysis:   Recent Labs Lab 12/11/14 1146  COLORURINE YELLOW*  LABSPEC 1.017  PHURINE 6.0  GLUCOSEU NEGATIVE  HGBUR NEGATIVE  BILIRUBINUR NEGATIVE  KETONESUR TRACE*  PROTEINUR NEGATIVE  NITRITE NEGATIVE  LEUKOCYTESUR 3+*    Lipid Panel:  No results found for: CHOL, TRIG, HDL, CHOLHDL, VLDL, LDLCALC  HgbA1C: No results found for: HGBA1C  Urine Drug Screen:      Component Value Date/Time   LABOPIA NEGATIVE 08/30/2013 0040   LABBENZ NEGATIVE 08/30/2013 0040    AMPHETMU NEGATIVE 08/30/2013 0040   THCU NEGATIVE 08/30/2013 0040   LABBARB NEGATIVE 08/30/2013 0040    Alcohol Level: No results for input(s): ETH in the last 168 hours.  Other results: EKG: normal EKG, normal sinus rhythm, unchanged from previous tracings.  Imaging: No results found.   Assessment/Plan:   64 y.o. female female with a known history of seizure disorder, diabetes and arthritis. The patient was sent from facility to the ED due to seizure activity yesterday. Last seizure prior was about 2 yrs ago as per sister.  Pt is still agitated with outbursts of yelling.  On Keppra and group home.    Delerium has improved. Pt is close to baseline - con't home Keppra and treatment for UTI - d/c planning. No further imaging from neuro stand point.     Leotis Pain   12/14/2014, 1:20 PM

## 2014-12-14 NOTE — Progress Notes (Signed)
Speech Language Pathology Treatment: Dysphagia  Patient Details Name: Tina Holden MRN: [id number] DOB: March 17, 1951 Today's Date: 12/14/2014 Time: 0922-0950 SLP Time Calculation (min) (ACUTE ONLY): 28 min  Assessment / Plan / Recommendation Clinical Impression  Pt appears to be tolerating Dysphagia I diet w/thin liquids. No overt s/s of aspiration were observed w/thin and puree trials. Pt needed cues to slow rate and take smaller bites of puree. Pt reports that she wears dentures to eat, but does not currently have them with her. Solid trials were not given because pt does not have dentures. Recommend continue w/dysphagia I diet w/thin liquids and aspiration precautions. Meds crushed in puree as able. Will f/u re: toleration of diet in 1-3 days. May attempt trials of solid consistency when pt has dentures, possibly post discharge to SNF. Educated pt on diet and aspiration precautions. Nsg in agreement with plan.     HPI Other Pertinent Information: Tina Holden is a 64 y.o. 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 According to staff at the family care home where she lives, she was found in bed incontinent of urine and confused. When they tried to walk her, she reported feeling dizzy. No recent illness. No falls. Patient has no pain complaints at this time. She has had no witnessed GTC seizure. Nsg reports that pt has been tolerating diet (Dysphagia I w/thin) since BSE.    Pertinent Vitals Pain Assessment: No/denies pain  SLP Plan  Continue with current plan of care (Trials of solids when dentures are available)    Recommendations Diet recommendations: Dysphagia 1 (puree);Thin liquid Liquids provided via: Cup;Straw Medication Administration: Crushed with puree Supervision: Intermittent supervision to cue for  compensatory strategies;Patient able to self feed Compensations: Slow rate;Small sips/bites;Follow solids with liquid;Use straw to facilitate chin tuck;Check for pocketing Postural Changes and/or Swallow Maneuvers: Seated upright 90 degrees              Oral Care Recommendations: Oral care BID;Staff/trained caregiver to provide oral care Follow up Recommendations: Skilled Nursing facility Plan: Continue with current plan of care (Trials of solids when dentures are available)    GO     [place],Tomekia Helton 12/14/2014, 10:03 AM

## 2014-12-14 NOTE — Discharge Summary (Signed)
[facility] NAME: Tina Holden    MR#:  [id number]  DATE OF BIRTH:  02-07-1951  DATE OF ADMISSION:  12/11/2014 ADMITTING PHYSICIAN: Demetrios Loll, MD  DATE OF DISCHARGE: No discharge date for patient encounter.  PRIMARY CARE PHYSICIAN: No primary care provider on file.     ADMISSION DIAGNOSIS:  Seizure [R56.9] UTI (lower urinary tract infection) [N39.0] Post-ictal state [R56.9]  DISCHARGE DIAGNOSIS:  Principal Problem:   Seizure Active Problems:   Acute delirium   Acute cystitis without hematuria   Leukocytosis   Schizophrenia   Developmental disability   SECONDARY DIAGNOSIS:   Past Medical History  Diagnosis Date  . Seizures   . Diabetes mellitus without complication   . Arthritis     .pro HOSPITAL COURSE:   Patient is a 64 year old African-American female with past medical history significant for history of schizophrenia as well as known history of seizure disorder who presents to the hospital after suspected seizure episode in the group home. [place] patient was noted to be confused, but is unclear if anyone witnessed any seizure episode. She was found to be incontinent of urine. On arrival to emergency room, patient's labs revealed elevated white blood cell count to 13.4 and significant pyuria, also renal insufficiency. Patient was initiated on antibiotic therapy with Rocephin for presumed urinary tract infection and urine cultures were taken. Urine cultures revealed more than 100,000 colony-forming units of Escherichia coli pansensitive. Patient was changed to Keflex orally, which she is to continue for 2 more days to complete course. She was rehydrated and her kidney function normalized. Patient was seen by neurologist as well as psychiatrist while she was in the hospital. Neurologist felt that [place]o for your for seizure disorder, may not be the best medication for patient with psychiatric comorbidities, but he  recommended to continue follow-up with primary neurologist. Due to concern of lowering seizure threshold with home medication Navane, psychiatry consultation was also obtained, although psychiatrist did not feel that psychiatric medications need to be changed. As patient had delirium while in the hospital. She was initiated on Seroquel as well, watching her QTc interval closely. This conservative therapy she improved significantly and was ready to be discharged back to group home today, 12/14/2014 Discussion by problem 1. Seizure, patient was seen by neurologist. She is to continue [place] , changed to oral, follow-up with primary neurologist as outpatient to determine if Keppra is the best medication for patient with psychiatric comorbidities.  2. Delirium after seizure and due to urinary tract infection, improved clinically. Continue patient on Seroquel 25 g twice daily dose per neurologist recommendations, wean Seroquel off as outpatient. Repeat EKG stat, following QTC closely. Normal magnesium level. Speech evaluated patient and recommended dysphagia 1 diet with thin liquids.  3. Urinary tract infection due to Escherichia coli, pansensitive. Of Rocephin, continue patient on Keflex orally for 2 more days to complete 5 day course 4. Leukocytosis. Resolved with antibiotic therapy 5. Renal insufficiency, resolved with IV fluids 6. Generalized weakness, continue physical therapy, discharge to group home today with home health  DISCHARGE CONDITIONS:  Stable  CONSULTS OBTAINED:  Treatment Team:  Leotis Pain, MD Gonzella Lex, MD  DRUG ALLERGIES:   Allergies  Allergen Reactions  . Asa [Aspirin] Other (See Comments)    Unknown   . Chocolate Flavor   . Codeine Other (See Comments)    unknown  . Dilantin [Phenytoin Sodium Extended]     DISCHARGE MEDICATIONS:  Current Discharge Medication List    START taking these medications   Details  cephALEXin (KEFLEX) 250 MG capsule Take 1  capsule (250 mg total) by mouth 3 (three) times daily. Qty: 6 capsule, Refills: 0    levETIRAcetam (KEPPRA) 500 MG tablet Take 1 tablet (500 mg total) by mouth 2 (two) times daily. Qty: 60 tablet, Refills: 6    QUEtiapine (SEROQUEL) 25 MG tablet Take 1 tablet (25 mg total) by mouth 2 (two) times daily. Qty: 60 tablet, Refills: 0      CONTINUE these medications which have NOT CHANGED   Details  benztropine (COGENTIN) 1 MG tablet Take 1 mg by mouth 2 (two) times daily.    calcium-vitamin D (OSCAL WITH D) 500-200 MG-UNIT per tablet Take 1 tablet by mouth 2 (two) times daily.    Cholecalciferol 5000 UNITS capsule Take 5,000 Units by mouth daily.    gabapentin (NEURONTIN) 300 MG capsule Take 300 mg by mouth 2 (two) times daily.    nystatin (MYCOSTATIN/NYSTOP) 100000 UNIT/GM POWD Apply 1 Bottle topically 2 (two) times daily as needed. Apply a small amount    polyethylene glycol (MIRALAX / GLYCOLAX) packet Take 17 g by mouth daily as needed for mild constipation.    ranitidine (ZANTAC) 150 MG tablet Take 150 mg by mouth at bedtime.    thiothixene (NAVANE) 5 MG capsule Take 5 mg by mouth at bedtime.    traZODone (DESYREL) 100 MG tablet Take 100 mg by mouth at bedtime as needed for sleep.    triamcinolone cream (KENALOG) 0.1 % Apply 1 application topically daily as needed.    vitamin B-12 (CYANOCOBALAMIN) 1000 MCG tablet Take 1,000 mcg by mouth daily.         DISCHARGE INSTRUCTIONS:    Patient is to follow-up with primary care physician. Physical therapy as outpatient  If you experience worsening of your admission symptoms, develop shortness of breath, life threatening emergency, suicidal or homicidal thoughts you must seek medical attention immediately by calling 911 or calling your MD immediately  if symptoms less severe.  You Must read complete instructions/literature along with all the possible adverse reactions/side effects for all the Medicines you take and that have been  prescribed to you. Take any new Medicines after you have completely understood and accept all the possible adverse reactions/side effects.   Please note  You were cared for by a hospitalist during your hospital stay. If you have any questions about your discharge medications or the care you received while you were in the hospital after you are discharged, you can call the unit and asked to speak with the hospitalist on call if the hospitalist that took care of you is not available. Once you are discharged, your primary care physician will handle any further medical issues. Please note that NO REFILLS for any discharge medications will be authorized once you are discharged, as it is imperative that you return to your primary care physician (or establish a relationship with a primary care physician if you do not have one) for your aftercare needs so that they can reassess your need for medications and monitor your lab values.    Today   CHIEF COMPLAINT:   Chief Complaint  Patient presents with  . Seizures    HISTORY OF PRESENT ILLNESS:  Bryttani Blew  is a 64 y.o. female with a known history of schizophrenia as well as known history of seizure disorder who presents to the hospital after suspected seizure episode in the group  home. [place] patient was noted to be confused, but is unclear if anyone witnessed any seizure episode. She was found to be incontinent of urine. On arrival to emergency room, patient's labs revealed elevated white blood cell count to 13.4 and significant pyuria, also renal insufficiency. Patient was initiated on antibiotic therapy with Rocephin for presumed urinary tract infection and urine cultures were taken. Urine cultures revealed more than 100,000 colony-forming units of Escherichia coli pansensitive. Patient was changed to Keflex orally, which she is to continue for 2 more days to complete course. She was rehydrated and her kidney function normalized. Patient was seen by  neurologist as well as psychiatrist while she was in the hospital. Neurologist felt that [place] for your for seizure disorder, may not be the best medication for patient with psychiatric comorbidities, but he recommended to continue follow-up with primary neurologist. Due to concern of lowering seizure threshold with home medication Navane, psychiatry consultation was also obtained, although psychiatrist did not feel that psychiatric medications need to be changed. As patient had delirium while in the hospital. She was initiated on Seroquel as well, watching her QTc interval closely. This conservative therapy she improved significantly and was ready to be discharged back to group home today, 12/14/2014 Discussion by problem 1. Seizure, patient was seen by neurologist. She is to continue [place] , changed to oral, follow-up with primary neurologist as outpatient to determine if Keppra is the best medication for patient with psychiatric comorbidities.  2. Delirium after seizure and due to urinary tract infection, improved clinically. Continue patient on Seroquel 25 g twice daily dose per neurologist recommendations, wean Seroquel off as outpatient. Repeat EKG stat, following QTC closely. Normal magnesium level. Speech evaluated patient and recommended dysphagia 1 diet with thin liquids.  3. Urinary tract infection due to Escherichia coli, pansensitive. Of Rocephin, continue patient on Keflex orally for 2 more days to complete 5 day course 4. Leukocytosis. Resolved with antibiotic therapy 5. Renal insufficiency, resolved with IV fluids 6. Generalized weakness, continue physical therapy, discharge to group home today with home health    VITAL SIGNS:  Blood pressure 154/94, pulse 69, temperature 98.7 F (37.1 C), temperature source Oral, resp. rate 16, height 5\' 6"  (1.676 m), weight 81.647 kg (180 lb), SpO2 98 %.  I/O:   Intake/Output Summary (Last 24 hours) at 12/14/14 1047 Last data filed at 12/14/14  0900  Gross per 24 hour  Intake    360 ml  Output      1 ml  Net    359 ml    PHYSICAL EXAMINATION:  GENERAL:  64 y.o.-year-old patient lying in the bed with no acute distress.  EYES: Pupils equal, round, reactive to light and accommodation. No scleral icterus. Extraocular muscles intact.  HEENT: Head atraumatic, normocephalic. Oropharynx and nasopharynx clear.  NECK:  Supple, no jugular venous distention. No thyroid enlargement, no tenderness.  LUNGS: Normal breath sounds bilaterally, no wheezing, rales,rhonchi or crepitation. No use of accessory muscles of respiration.  CARDIOVASCULAR: S1, S2 normal. No murmurs, rubs, or gallops.  ABDOMEN: Soft, non-tender, non-distended. Bowel sounds present. No organomegaly or mass.  EXTREMITIES: No pedal edema, cyanosis, or clubbing.  NEUROLOGIC: Cranial nerves II through XII are intact. Muscle strength 5/5 in all extremities. Sensation intact. Gait not checked.  PSYCHIATRIC: The patient is alert and oriented x 3.  SKIN: No obvious rash, lesion, or ulcer.   DATA REVIEW:   CBC  Recent Labs Lab 12/13/14 0443  WBC 8.6  HGB 13.5  HCT  40.3  PLT 214    Chemistries   Recent Labs Lab 12/11/14 1022 12/12/14 0444 12/13/14 0443  NA 139 138  --   K 3.9 3.5  --   CL 105 109  --   CO2 27 21*  --   GLUCOSE 148* 130*  --   BUN 14 10  --   CREATININE 1.07* 0.73  --   CALCIUM 9.7 8.4*  --   MG  --   --  2.3  AST 24  --   --   ALT 20  --   --   ALKPHOS 85  --   --   BILITOT 1.1  --   --     Cardiac Enzymes  Recent Labs Lab 12/11/14 1022  TROPONINI <0.03    Microbiology Results  Results for orders placed or performed during the hospital encounter of 12/11/14  Urine culture     Status: None   Collection Time: 12/11/14 11:46 AM  Result Value Ref Range Status   Specimen Description URINE, CATHETERIZED  Final   Special Requests NONE  Final   Culture >=100,000 COLONIES/mL ESCHERICHIA COLI  Final   Report Status 12/13/2014 FINAL   Final   Organism ID, Bacteria ESCHERICHIA COLI  Final      Susceptibility   Escherichia coli - MIC*    AMPICILLIN <=2 SENSITIVE Sensitive     CEFTAZIDIME <=1 SENSITIVE Sensitive     CEFAZOLIN <=4 SENSITIVE Sensitive     CEFTRIAXONE <=1 SENSITIVE Sensitive     CIPROFLOXACIN <=0.25 SENSITIVE Sensitive     GENTAMICIN <=1 SENSITIVE Sensitive     IMIPENEM <=0.25 SENSITIVE Sensitive     TRIMETH/SULFA <=20 SENSITIVE Sensitive     NITROFURANTOIN Value in next row Sensitive      SENSITIVE<=16    PIP/TAZO Value in next row Sensitive      SENSITIVE<=4    * >=100,000 COLONIES/mL ESCHERICHIA COLI    RADIOLOGY:  No results found.  EKG:   Orders placed or performed during the hospital encounter of 12/11/14  . EKG 12-Lead  . EKG 12-Lead  . ED EKG  . ED EKG  . EKG 12-Lead  . EKG 12-Lead  . EKG 12-Lead  . EKG 12-Lead      Management plans discussed with the patient, family and they are in agreement.  CODE STATUS:     Code Status Orders        Start     Ordered   12/11/14 1448  Full code   Continuous     12/11/14 1447      TOTAL TIME TAKING CARE OF THIS PATIENT: 45 minutes.    Theodoro Grist M.D on 12/14/2014 at 10:47 AM  Between 7am to 6pm - Pager - [phone]  After 6pm go to [email or web address] - password [email or web address] [facility]  [facility]  Office  [phone]  CC: Primary care physician; No primary care provider on file.

## 2014-12-14 NOTE — Care Management (Signed)
Patient for discharge this afternoon back to group home. Informed Floydene Flock at [facility].  Patient has medicaid and does not have qualifying diagnosis under medicaid for PT or Speech.  Informed attending of this .  Patient will be discharged with nursing visits from [facility]. No other CM needs. CSW following for transfer back to group home.

## 2015-07-16 ENCOUNTER — Other Ambulatory Visit: Payer: Self-pay | Admitting: Family Medicine

## 2015-07-16 DIAGNOSIS — Z1231 Encounter for screening mammogram for malignant neoplasm of breast: Secondary | ICD-10-CM

## 2015-07-19 ENCOUNTER — Encounter: Payer: Self-pay | Admitting: *Deleted

## 2015-07-19 DIAGNOSIS — Y999 Unspecified external cause status: Secondary | ICD-10-CM | POA: Diagnosis not present

## 2015-07-19 DIAGNOSIS — Y939 Activity, unspecified: Secondary | ICD-10-CM | POA: Diagnosis not present

## 2015-07-19 DIAGNOSIS — F209 Schizophrenia, unspecified: Secondary | ICD-10-CM | POA: Diagnosis not present

## 2015-07-19 DIAGNOSIS — E119 Type 2 diabetes mellitus without complications: Secondary | ICD-10-CM | POA: Insufficient documentation

## 2015-07-19 DIAGNOSIS — S0990XA Unspecified injury of head, initial encounter: Secondary | ICD-10-CM | POA: Insufficient documentation

## 2015-07-19 DIAGNOSIS — Y929 Unspecified place or not applicable: Secondary | ICD-10-CM | POA: Diagnosis not present
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
## 2015-07-20 ENCOUNTER — Emergency Department
Admission: EM | Admit: 2015-07-20 | Discharge: 2015-07-20 | Disposition: A | Discharge status: Home / Self Care | Payer: Medicare Other | Attending: Emergency Medicine | Admitting: Emergency Medicine

## 2015-07-20 DIAGNOSIS — S0990XA Unspecified injury of head, initial encounter: Secondary | ICD-10-CM

## 2015-07-20 HISTORY — DX: Schizophrenia, unspecified: F20.9

## 2015-07-20 NOTE — Discharge Instructions (Signed)
General Assault  Assault includes any behavior or physical attack--whether it is on purpose or not--that results in injury to another person, damage to property, or both. This also includes assault that has not yet happened, but is planned to happen. Threats of assault may be physical, verbal, or written. They may be said or sent by:   Mail.   E-mail.   Text.   Social media.   Fax.  The threats may be direct, implied, or understood.  WHAT ARE THE DIFFERENT FORMS OF ASSAULT?  Forms of assault include:   Physically assaulting a person. This includes physical threats to inflict physical harm as well as:    Slapping.    Hitting.    Poking.    Kicking.    Punching.    Pushing.   Sexually assaulting a person. Sexual assault is any sexual activity that a person is forced, threatened, or coerced to participate in. It may or may not involve physical contact with the person who is assaulting you. You are sexually assaulted if you are forced to have sexual contact of any kind.   Damaging or destroying a person's assistive equipment, such as glasses, canes, or walkers.   Throwing or hitting objects.   Using or displaying a weapon to harm or threaten someone.   Using or displaying an object that appears to be a weapon in a threatening manner.   Using greater physical size or strength to intimidate someone.   Making intimidating or threatening gestures.   Bullying.   Hazing.   Using language that is intimidating, threatening, hostile, or abusive.   Stalking.   Restraining someone with force.  WHAT SHOULD I DO IF I EXPERIENCE ASSAULT?   Report assaults, threats, and stalking to the police. Call your local emergency services (911 in the [place] if you are in immediate danger or you need medical help.   You can work with a lawyer or an advocate to get legal protection against someone who has assaulted you or threatened you with assault. Protection includes restraining orders and private addresses. Crimes against  you, such as assault, can also be prosecuted through the courts. Laws will vary depending on where you live.     This information is not intended to replace advice given to you by your health care provider. Make sure you discuss any questions you have with your health care provider.     Document Released: 03/09/2005 Document Revised: 03/30/2014 Document Reviewed: 11/24/2013  [employer] Interactive Patient Education 2016 [employer]
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
## 2015-07-30 ENCOUNTER — Ambulatory Visit: Payer: Medicare Other

## 2015-08-01 ENCOUNTER — Ambulatory Visit
Admission: RE | Admit: 2015-08-01 | Discharge: 2015-08-01 | Disposition: A | Discharge status: Home / Self Care | Payer: Medicare Other | Source: Ambulatory Visit | Attending: Family Medicine | Admitting: Family Medicine

## 2015-08-01 ENCOUNTER — Other Ambulatory Visit: Payer: Self-pay | Admitting: Family Medicine

## 2015-08-01 DIAGNOSIS — Z1231 Encounter for screening mammogram for malignant neoplasm of breast: Secondary | ICD-10-CM

## 2016-06-29 ENCOUNTER — Other Ambulatory Visit: Payer: Self-pay | Admitting: Family Medicine

## 2016-06-29 DIAGNOSIS — Z1231 Encounter for screening mammogram for malignant neoplasm of breast: Secondary | ICD-10-CM

## 2016-08-04 ENCOUNTER — Ambulatory Visit
Admission: RE | Admit: 2016-08-04 | Discharge: 2016-08-04 | Disposition: A | Discharge status: Home / Self Care | Payer: Medicare Other | Source: Ambulatory Visit | Attending: Family Medicine | Admitting: Family Medicine

## 2016-08-04 DIAGNOSIS — Z1231 Encounter for screening mammogram for malignant neoplasm of breast: Secondary | ICD-10-CM | POA: Insufficient documentation

## 2017-06-25 ENCOUNTER — Other Ambulatory Visit: Payer: Self-pay | Admitting: Family Medicine

## 2017-06-25 DIAGNOSIS — Z1231 Encounter for screening mammogram for malignant neoplasm of breast: Secondary | ICD-10-CM

## 2017-08-06 ENCOUNTER — Ambulatory Visit
Admission: RE | Admit: 2017-08-06 | Discharge: 2017-08-06 | Disposition: A | Discharge status: Home / Self Care | Payer: Medicare Other | Source: Ambulatory Visit | Attending: Family Medicine | Admitting: Family Medicine

## 2017-08-06 ENCOUNTER — Encounter: Payer: Self-pay | Admitting: Radiology

## 2017-08-06 DIAGNOSIS — Z1231 Encounter for screening mammogram for malignant neoplasm of breast: Secondary | ICD-10-CM

## 2017-08-10 ENCOUNTER — Other Ambulatory Visit: Payer: Self-pay | Admitting: Family Medicine

## 2017-08-10 DIAGNOSIS — R928 Other abnormal and inconclusive findings on diagnostic imaging of breast: Secondary | ICD-10-CM

## 2017-08-10 DIAGNOSIS — R921 Mammographic calcification found on diagnostic imaging of breast: Secondary | ICD-10-CM

## 2017-08-20 ENCOUNTER — Ambulatory Visit
Admission: RE | Admit: 2017-08-20 | Discharge: 2017-08-20 | Disposition: A | Discharge status: Home / Self Care | Payer: Medicare Other | Source: Ambulatory Visit | Attending: Family Medicine | Admitting: Family Medicine

## 2017-08-20 DIAGNOSIS — R921 Mammographic calcification found on diagnostic imaging of breast: Secondary | ICD-10-CM

## 2017-08-20 DIAGNOSIS — R928 Other abnormal and inconclusive findings on diagnostic imaging of breast: Secondary | ICD-10-CM

## 2017-08-24 ENCOUNTER — Other Ambulatory Visit: Payer: Self-pay | Admitting: Family Medicine

## 2017-08-24 DIAGNOSIS — R928 Other abnormal and inconclusive findings on diagnostic imaging of breast: Secondary | ICD-10-CM

## 2017-08-24 DIAGNOSIS — R921 Mammographic calcification found on diagnostic imaging of breast: Secondary | ICD-10-CM

## 2017-09-02 ENCOUNTER — Ambulatory Visit
Admission: RE | Admit: 2017-09-02 | Discharge: 2017-09-02 | Disposition: A | Discharge status: Home / Self Care | Payer: Medicare Other | Source: Ambulatory Visit | Attending: Family Medicine | Admitting: Family Medicine

## 2017-09-02 DIAGNOSIS — R921 Mammographic calcification found on diagnostic imaging of breast: Secondary | ICD-10-CM | POA: Diagnosis present

## 2017-09-02 DIAGNOSIS — R928 Other abnormal and inconclusive findings on diagnostic imaging of breast: Secondary | ICD-10-CM

## 2017-09-02 HISTORY — PX: BREAST BIOPSY: SHX20

## 2017-09-03 LAB — SURGICAL PATHOLOGY

## 2018-12-18 ENCOUNTER — Emergency Department: Payer: Medicare Other

## 2018-12-18 ENCOUNTER — Other Ambulatory Visit: Payer: Self-pay

## 2018-12-18 ENCOUNTER — Emergency Department
Admission: EM | Admit: 2018-12-18 | Discharge: 2018-12-18 | Disposition: A | Discharge status: Home / Self Care | Payer: Medicare Other | Attending: Emergency Medicine | Admitting: Emergency Medicine

## 2018-12-18 ENCOUNTER — Encounter: Payer: Self-pay | Admitting: Emergency Medicine

## 2018-12-18 DIAGNOSIS — Z79899 Other long term (current) drug therapy: Secondary | ICD-10-CM | POA: Diagnosis not present

## 2018-12-18 DIAGNOSIS — R4182 Altered mental status, unspecified: Secondary | ICD-10-CM | POA: Diagnosis present

## 2018-12-18 DIAGNOSIS — R404 Transient alteration of awareness: Secondary | ICD-10-CM

## 2018-12-18 LAB — CBC WITH DIFFERENTIAL/PLATELET
Abs Immature Granulocytes: 0.02 10*3/uL (ref 0.00–0.07)
Basophils Absolute: 0 10*3/uL (ref 0.0–0.1)
Basophils Relative: 0 %
Eosinophils Absolute: 0 10*3/uL (ref 0.0–0.5)
Eosinophils Relative: 0 %
HCT: 43.7 % (ref 36.0–46.0)
Hemoglobin: 14.3 g/dL (ref 12.0–15.0)
Immature Granulocytes: 0 %
Lymphocytes Relative: 7 %
Lymphs Abs: 0.6 10*3/uL — ABNORMAL LOW (ref 0.7–4.0)
MCH: 29.3 pg (ref 26.0–34.0)
MCHC: 32.7 g/dL (ref 30.0–36.0)
MCV: 89.5 fL (ref 80.0–100.0)
Monocytes Absolute: 0.3 10*3/uL (ref 0.1–1.0)
Monocytes Relative: 4 %
Neutro Abs: 7 10*3/uL (ref 1.7–7.7)
Neutrophils Relative %: 89 %
Platelets: 224 10*3/uL (ref 150–400)
RBC: 4.88 MIL/uL (ref 3.87–5.11)
RDW: 11.9 % (ref 11.5–15.5)
WBC: 7.9 10*3/uL (ref 4.0–10.5)
nRBC: 0 % (ref 0.0–0.2)

## 2018-12-18 LAB — COMPREHENSIVE METABOLIC PANEL
ALT: 13 U/L (ref 0–44)
AST: 23 U/L (ref 15–41)
Albumin: 4.7 g/dL (ref 3.5–5.0)
Alkaline Phosphatase: 72 U/L (ref 38–126)
Anion gap: 11 (ref 5–15)
BUN: 14 mg/dL (ref 8–23)
CO2: 24 mmol/L (ref 22–32)
Calcium: 9.4 mg/dL (ref 8.9–10.3)
Chloride: 104 mmol/L (ref 98–111)
Creatinine, Ser: 0.98 mg/dL (ref 0.44–1.00)
GFR calc Af Amer: >60 mL/min (ref >60)
GFR calc non Af Amer: 59 mL/min — ABNORMAL LOW (ref >60)
Glucose, Bld: 137 mg/dL — ABNORMAL HIGH (ref 70–99)
Potassium: 4.1 mmol/L (ref 3.5–5.1)
Sodium: 139 mmol/L (ref 135–145)
Total Bilirubin: 0.9 mg/dL (ref 0.3–1.2)
Total Protein: 7.5 g/dL (ref 6.5–8.1)

## 2018-12-18 LAB — URINALYSIS, ROUTINE W REFLEX MICROSCOPIC
Bacteria, UA: NONE SEEN
Bilirubin Urine: NEGATIVE
Glucose, UA: NEGATIVE mg/dL
Hgb urine dipstick: NEGATIVE
Ketones, ur: NEGATIVE mg/dL
Leukocytes,Ua: NEGATIVE
Nitrite: NEGATIVE
Protein, ur: 30 mg/dL — AB
Specific Gravity, Urine: 1.016 (ref 1.005–1.030)
pH: 7 (ref 5.0–8.0)

## 2018-12-18 LAB — LIPASE, BLOOD: Lipase: 27 U/L (ref 11–51)

## 2018-12-18 LAB — T4, FREE: Free T4: 0.93 ng/dL (ref 0.61–1.12)

## 2018-12-18 LAB — LACTIC ACID, PLASMA: Lactic Acid, Venous: 1.7 mmol/L (ref 0.5–1.9)

## 2018-12-18 NOTE — ED Notes (Signed)
Iv d/c'd intact from left ac, pt tol well

## 2018-12-18 NOTE — ED Provider Notes (Signed)
[facility] Emergency Department Provider Note  ____________________________________________   First MD Initiated Contact with Patient 12/18/18 [phone]     (approximate)  I have reviewed the triage vital signs and the nursing notes.   HISTORY  Chief Complaint Altered Mental Status    HPI Tina Holden is a 68 y.o. female with diabetes, schizophrenia, seizures who presents with altered mental status.  Patient's last known normal was last night.  Supposedly this morning when staff found her she was more confused.  They were able to give EMS an exact description of what was off they just said that she was not acting her normal self.  Patient is oriented x1.  She denies any pain.  Denies any chest pain, abdominal pain, shortness of breath, cough, urinary symptoms.   Unable to get full HPI due to patient's altered mental status.  Patient is coming from an assisted living facility.  Per EMS patient was able to ambulate with them, had normal sugar, normal vital signs.          Past Medical History:  Diagnosis Date   Arthritis    Diabetes mellitus without complication ([place])    Schizophrenia ([place])    Seizures ([place])     Patient Active Problem List   Diagnosis Date Noted   Acute cystitis without hematuria 12/14/2014   Leukocytosis 12/14/2014   Schizophrenia ([place]) 12/13/2014   Acute delirium 12/13/2014   Developmental disability 12/13/2014   Seizure ([place]) 12/11/2014    Past Surgical History:  Procedure Laterality Date   ABDOMINAL HYSTERECTOMY     BREAST BIOPSY Left 2013,2015   neg- core   BREAST BIOPSY Left 09/02/2017   affirm stereo biopsy/ path pending    Prior to Admission medications   Medication Sig Start Date End Date Taking? Authorizing Provider  benztropine (COGENTIN) 1 MG tablet Take 1 mg by mouth 2 (two) times daily.    [provider]  calcium-vitamin D (OSCAL WITH D) 500-200 MG-UNIT per tablet Take 1  tablet by mouth 2 (two) times daily.    [provider]  cephALEXin (KEFLEX) 250 MG capsule Take 1 capsule (250 mg total) by mouth 3 (three) times daily. 12/14/14   Theodoro Grist, MD  Cholecalciferol 5000 UNITS capsule Take 5,000 Units by mouth daily.    [provider]  gabapentin (NEURONTIN) 300 MG capsule Take 300 mg by mouth 2 (two) times daily.    [provider]  levETIRAcetam (KEPPRA) 500 MG tablet Take 1 tablet (500 mg total) by mouth 2 (two) times daily. 12/14/14   Theodoro Grist, MD  nystatin (MYCOSTATIN/NYSTOP) 100000 UNIT/GM POWD Apply 1 Bottle topically 2 (two) times daily as needed. Apply a small amount    [provider]  polyethylene glycol (MIRALAX / GLYCOLAX) packet Take 17 g by mouth daily as needed for mild constipation.    [provider]  QUEtiapine (SEROQUEL) 25 MG tablet Take 1 tablet (25 mg total) by mouth 2 (two) times daily. 12/14/14   Theodoro Grist, MD  ranitidine (ZANTAC) 150 MG tablet Take 150 mg by mouth at bedtime.    [provider]  thiothixene (NAVANE) 5 MG capsule Take 5 mg by mouth at bedtime.    [provider]  traZODone (DESYREL) 100 MG tablet Take 100 mg by mouth at bedtime as needed for sleep.    [provider]  triamcinolone cream (KENALOG) 0.1 % Apply 1 application topically daily as needed.    [provider]  vitamin B-12 (CYANOCOBALAMIN) 1000 MCG tablet Take 1,000 mcg by mouth daily.    [provider]    Allergies Asa [aspirin], Chocolate flavor, Codeine, and Dilantin [phenytoin sodium extended]  Family History  Problem Relation Age of Onset   Breast cancer Maternal Aunt    Breast cancer Maternal Grandmother     Social History Social History   Tobacco Use   Smoking status: Never Smoker   Smokeless tobacco: Never Used  Substance Use Topics   Alcohol use: No   Drug use: No      Review of Systems Constitutional: No fever/chills Eyes: No  visual changes. ENT: No sore throat. Cardiovascular: Denies chest pain. Respiratory: Denies shortness of breath. Gastrointestinal: No abdominal pain.  No nausea, no vomiting.  No diarrhea.  No constipation. Genitourinary: Negative for dysuria. Musculoskeletal: Negative for back pain. Skin: Negative for rash. Neurological: Negative for headaches, focal weakness or numbness. Patient denies any symptoms although the history is somewhat limited due to patient's confusion. ____________________________________________   PHYSICAL EXAM:  VITAL SIGNS: Blood pressure 135/81, pulse 66, temperature 99.4 F (37.4 C), temperature source Oral, resp. rate 16, height 5\' 6"  (1.676 m), weight 86.2 kg, SpO2 99 %.  Constitutional: Alert and oriented x1  Eyes: Conjunctivae are normal. EOMI. Head: Atraumatic. Nose: No congestion/rhinnorhea. Mouth/Throat: Mucous membranes are moist.   Neck: No stridor. Trachea Midline. FROM Cardiovascular: Normal rate, regular rhythm. Grossly normal heart sounds.  Good peripheral circulation. Respiratory: Normal respiratory effort.  No retractions. Lungs CTAB. Gastrointestinal: Soft and nontender. No distention. No abdominal bruits.  Musculoskeletal: No lower extremity tenderness nor edema.  No joint effusions. Neurologic: Equal strength in the arms and legs.  No pronator drift.  Sensation seems intact.  Patient does not seem to follow commands well but no obvious facial droop.  Eye muscles seem intact. Skin:  Skin is warm, dry and intact. No rash noted. Psychiatric: Patient is pleasant with some confusion (unclear baseline)  GU: Deferred   ____________________________________________   LABS (all labs ordered are listed, but only abnormal results are displayed)  Labs Reviewed  CBC WITH DIFFERENTIAL/PLATELET - Abnormal; Notable for the following components:      Result Value   Lymphs Abs 0.6 (*)    All other components within normal limits  COMPREHENSIVE METABOLIC  PANEL - Abnormal; Notable for the following components:   Glucose, Bld 137 (*)    GFR calc non Af Amer 59 (*)    All other components within normal limits  URINALYSIS, ROUTINE W REFLEX MICROSCOPIC - Abnormal; Notable for the following components:   Color, Urine YELLOW (*)    APPearance CLEAR (*)    Protein, ur 30 (*)    All other components within normal limits  URINE CULTURE  LIPASE, BLOOD  LACTIC ACID, PLASMA  T4, FREE   ____________________________________________   ED ECG REPORT I, Vanessa Bee, the attending physician, personally viewed and interpreted this ECG.  EKG is normal sinus rate of 69, no ST elevation, no T wave inversion, RSR prime in V1 and V2, normal intervals ____________________________________________  RADIOLOGY Robert Bellow, personally viewed and evaluated these images (plain radiographs) as part of my medical decision making, as well as reviewing the written report by the radiologist.  ED MD interpretation: Chest x-ray no evidence of pneumonia  Official radiology report(s): Ct Head Wo Contrast  Result Date: 12/18/2018 CLINICAL DATA:  Altered level of consciousness. EXAM: CT HEAD WITHOUT CONTRAST TECHNIQUE: Contiguous axial images were obtained from the base  of the skull through the vertex without intravenous contrast. COMPARISON:  CT scan of December 11, 2014. FINDINGS: Brain: No evidence of acute infarction, hemorrhage, hydrocephalus, extra-axial collection or mass lesion/mass effect. Vascular: No hyperdense vessel or unexpected calcification. Skull: Normal. Negative for fracture or focal lesion. Sinuses/Orbits: No acute finding. Other: None. IMPRESSION: Normal head CT. Electronically Signed   By: Marijo Conception M.D.   On: 12/18/2018 10:15   Dg Chest Portable 1 View  Result Date: 12/18/2018 CLINICAL DATA:  Confusion.  Mental status changes. EXAM: PORTABLE CHEST 1 VIEW COMPARISON:  12/11/2014 FINDINGS: 0959 hours. The lungs are clear without focal  pneumonia, edema, pneumothorax or pleural effusion. Interstitial markings are diffusely coarsened with chronic features. The cardio pericardial silhouette is enlarged. The visualized bony structures of the thorax are intact. Telemetry leads overlie the chest. IMPRESSION: Cardiomegaly with underlying chronic interstitial changes. No acute cardiopulmonary findings. Electronically Signed   By: Misty Stanley M.D.   On: 12/18/2018 10:24    ____________________________________________   PROCEDURES  Procedure(s) performed (including Critical Care):  Procedures   ____________________________________________   INITIAL IMPRESSION / ASSESSMENT AND PLAN / ED COURSE  Kloi Baz was evaluated in Emergency Department on 12/18/2018 for the symptoms described in the history of present illness. She was evaluated in the context of the global COVID-19 pandemic, which necessitated consideration that the patient might be at risk for infection with the SARS-CoV-2 virus that causes COVID-19. Institutional protocols and algorithms that pertain to the evaluation of patients at risk for COVID-19 are in a state of rapid change based on information released by regulatory bodies including the CDC and federal and state organizations. These policies and algorithms were followed during the patient's care in the ED.    Patient is a 68 year old with seizures, schizophrenia who presents with some altered mental status.  No obvious evidence of trauma on exam but will get CT head to evaluate for intracranial hemorrhage, tumor.  Patient has no focal deficits to suggest ischemic stroke and patient will be out of the window for TPA.  Will get urine to evaluate for UTI.  Will check electrolytes, thyroid studies.  We will make sure no evidence of infection and will get pneumonia and lactate level.  It is possible patient has seizure overnight that was unwitnessed and she was just postictal this morning.   Unclear what  facility patient is coming from.  Called the [facility] and she is not a resident there.  Attempted to call patient's sister listed as the primary contact and got a response from a family member who is going to try to get the sister to call me back.  D/w pt sister and per her patient baseline "has mentality of a 68 years old" my description of pt seems to be at baseline now.  The number for where she stays is [phone].  Per facility and this morning pt was acting weird. She was not as responsive and not talking to them. This happened a few years ago where she was admitted and had a UTI.  At that time there is concerned that she potentially could have had a seizure although no seizure activity was noted.  Patient also has a negative lactate level which if she had had a recent seizure you would expect this to have been level to be elevated.  I discussed with facility that is possible that patient did have a seizure but it is hard to know given she now seems to  be at patient's baseline and her work-up was negative.  We discussed not altering her medications at this time but if she was to have another episode that they would need to discuss with neurology to see if they want to adjust there seizure medications or return to the ER for further evaluation.  Facility is going to come and pick patient up and will be able to make sure that she is at baseline.   ____________________________________________   FINAL CLINICAL IMPRESSION(S) / ED DIAGNOSES   Final diagnoses:  Transient alteration of awareness      MEDICATIONS GIVEN DURING THIS VISIT:  Medications - No data to display   ED Discharge Orders    None       Note:  This document was prepared using Dragon voice recognition software and may include unintentional dictation errors.   Vanessa , MD 12/18/18 [phone]

## 2018-12-18 NOTE — ED Triage Notes (Signed)
PT to ER via EMS from an assisted living facility with c/o increased confusion this AM.  Pt arrives alert oriented to person and time only.

## 2018-12-18 NOTE — Discharge Instructions (Addendum)
Your work-up was reassuring including negative CT scan and negative urine.  Is unclear what caused this episode this morning.  It is possible that they could be related to a seizure that was not witnessed.  You should continue taking the seizure medication and return to the ER if she has another episode or you witnessed seizing.  Otherwise you can talk to her neurologist to discuss if they would want to increase the seizure medication or just continue to observe her.  Return to the ER for any other concerns

## 2018-12-19 LAB — URINE CULTURE: Culture: NO GROWTH

## 2019-02-15 ENCOUNTER — Other Ambulatory Visit: Payer: Self-pay | Admitting: Neurology

## 2019-02-15 DIAGNOSIS — R569 Unspecified convulsions: Secondary | ICD-10-CM

## 2019-03-01 ENCOUNTER — Ambulatory Visit
Admission: RE | Admit: 2019-03-01 | Discharge: 2019-03-01 | Disposition: A | Discharge status: Home / Self Care | Payer: Medicare Other | Source: Ambulatory Visit | Attending: Neurology | Admitting: Neurology

## 2019-03-01 ENCOUNTER — Other Ambulatory Visit: Payer: Self-pay

## 2019-03-01 DIAGNOSIS — R569 Unspecified convulsions: Secondary | ICD-10-CM | POA: Insufficient documentation

## 2019-11-10 ENCOUNTER — Other Ambulatory Visit: Payer: Self-pay | Admitting: Neurology

## 2019-11-10 ENCOUNTER — Other Ambulatory Visit (HOSPITAL_COMMUNITY): Payer: Self-pay | Admitting: Neurology

## 2019-11-10 DIAGNOSIS — R569 Unspecified convulsions: Secondary | ICD-10-CM

## 2019-11-29 ENCOUNTER — Ambulatory Visit: Payer: Medicare Other

## 2019-11-30 ENCOUNTER — Other Ambulatory Visit: Payer: Self-pay | Admitting: Neurology

## 2019-11-30 ENCOUNTER — Ambulatory Visit
Admission: RE | Admit: 2019-11-30 | Discharge: 2019-11-30 | Disposition: A | Discharge status: Home / Self Care | Payer: Medicare Other | Source: Ambulatory Visit | Attending: Neurology | Admitting: Neurology

## 2019-11-30 ENCOUNTER — Other Ambulatory Visit: Payer: Self-pay

## 2019-11-30 DIAGNOSIS — R569 Unspecified convulsions: Secondary | ICD-10-CM

## 2019-11-30 MED ORDER — GADOBUTROL 1 MMOL/ML IV SOLN: 7.5000 mL | Freq: Once | INTRAVENOUS | Status: DC | PRN

## 2020-06-14 ENCOUNTER — Inpatient Hospital Stay
Admission: EM | Admit: 2020-06-14 | Discharge: 2020-06-17 | DRG: 418 | Disposition: A | Discharge status: Home Health | Payer: Medicare Other | Source: Skilled Nursing Facility | Attending: Internal Medicine | Admitting: Internal Medicine

## 2020-06-14 ENCOUNTER — Emergency Department: Payer: Medicare Other

## 2020-06-14 ENCOUNTER — Other Ambulatory Visit: Payer: Self-pay

## 2020-06-14 DIAGNOSIS — R1013 Epigastric pain: Secondary | ICD-10-CM

## 2020-06-14 DIAGNOSIS — R569 Unspecified convulsions: Secondary | ICD-10-CM

## 2020-06-14 DIAGNOSIS — D1809 Hemangioma of other sites: Secondary | ICD-10-CM | POA: Diagnosis present

## 2020-06-14 DIAGNOSIS — K8013 Calculus of gallbladder with acute and chronic cholecystitis with obstruction: Secondary | ICD-10-CM | POA: Diagnosis not present

## 2020-06-14 DIAGNOSIS — K8050 Calculus of bile duct without cholangitis or cholecystitis without obstruction: Secondary | ICD-10-CM | POA: Diagnosis not present

## 2020-06-14 DIAGNOSIS — I10 Essential (primary) hypertension: Secondary | ICD-10-CM | POA: Diagnosis present

## 2020-06-14 DIAGNOSIS — E119 Type 2 diabetes mellitus without complications: Secondary | ICD-10-CM

## 2020-06-14 DIAGNOSIS — F89 Unspecified disorder of psychological development: Secondary | ICD-10-CM | POA: Diagnosis present

## 2020-06-14 DIAGNOSIS — Z885 Allergy status to narcotic agent status: Secondary | ICD-10-CM

## 2020-06-14 DIAGNOSIS — F79 Unspecified intellectual disabilities: Secondary | ICD-10-CM | POA: Diagnosis present

## 2020-06-14 DIAGNOSIS — Z90710 Acquired absence of both cervix and uterus: Secondary | ICD-10-CM

## 2020-06-14 DIAGNOSIS — Z79891 Long term (current) use of opiate analgesic: Secondary | ICD-10-CM

## 2020-06-14 DIAGNOSIS — Z79899 Other long term (current) drug therapy: Secondary | ICD-10-CM

## 2020-06-14 DIAGNOSIS — F209 Schizophrenia, unspecified: Secondary | ICD-10-CM | POA: Diagnosis present

## 2020-06-14 DIAGNOSIS — G40909 Epilepsy, unspecified, not intractable, without status epilepticus: Secondary | ICD-10-CM | POA: Diagnosis present

## 2020-06-14 DIAGNOSIS — D72829 Elevated white blood cell count, unspecified: Secondary | ICD-10-CM | POA: Diagnosis present

## 2020-06-14 DIAGNOSIS — M1909 Primary osteoarthritis, other specified site: Secondary | ICD-10-CM | POA: Diagnosis present

## 2020-06-14 DIAGNOSIS — Z886 Allergy status to analgesic agent status: Secondary | ICD-10-CM

## 2020-06-14 DIAGNOSIS — Z888 Allergy status to other drugs, medicaments and biological substances status: Secondary | ICD-10-CM

## 2020-06-14 DIAGNOSIS — Z20822 Contact with and (suspected) exposure to covid-19: Secondary | ICD-10-CM | POA: Diagnosis present

## 2020-06-14 DIAGNOSIS — Z91018 Allergy to other foods: Secondary | ICD-10-CM

## 2020-06-14 DIAGNOSIS — K862 Cyst of pancreas: Secondary | ICD-10-CM | POA: Diagnosis present

## 2020-06-14 DIAGNOSIS — E1165 Type 2 diabetes mellitus with hyperglycemia: Secondary | ICD-10-CM | POA: Diagnosis present

## 2020-06-14 LAB — CBC WITH DIFFERENTIAL/PLATELET
Abs Immature Granulocytes: 0.04 10*3/uL (ref 0.00–0.07)
Basophils Absolute: 0 10*3/uL (ref 0.0–0.1)
Basophils Relative: 0 %
Eosinophils Absolute: 0 10*3/uL (ref 0.0–0.5)
Eosinophils Relative: 0 %
HCT: 46.3 % — ABNORMAL HIGH (ref 36.0–46.0)
Hemoglobin: 15 g/dL (ref 12.0–15.0)
Immature Granulocytes: 0 %
Lymphocytes Relative: 3 %
Lymphs Abs: 0.3 10*3/uL — ABNORMAL LOW (ref 0.7–4.0)
MCH: 29.9 pg (ref 26.0–34.0)
MCHC: 32.4 g/dL (ref 30.0–36.0)
MCV: 92.4 fL (ref 80.0–100.0)
Monocytes Absolute: 0.7 10*3/uL (ref 0.1–1.0)
Monocytes Relative: 5 %
Neutro Abs: 11.9 10*3/uL — ABNORMAL HIGH (ref 1.7–7.7)
Neutrophils Relative %: 92 %
Platelets: 194 10*3/uL (ref 150–400)
RBC: 5.01 MIL/uL (ref 3.87–5.11)
RDW: 12.8 % (ref 11.5–15.5)
WBC: 13 10*3/uL — ABNORMAL HIGH (ref 4.0–10.5)
nRBC: 0 % (ref 0.0–0.2)

## 2020-06-14 LAB — COMPREHENSIVE METABOLIC PANEL
ALT: 132 U/L — ABNORMAL HIGH (ref 0–44)
AST: 203 U/L — ABNORMAL HIGH (ref 15–41)
Albumin: 4.7 g/dL (ref 3.5–5.0)
Alkaline Phosphatase: 93 U/L (ref 38–126)
Anion gap: 12 (ref 5–15)
BUN: 16 mg/dL (ref 8–23)
CO2: 24 mmol/L (ref 22–32)
Calcium: 9.4 mg/dL (ref 8.9–10.3)
Chloride: 101 mmol/L (ref 98–111)
Creatinine, Ser: 0.8 mg/dL (ref 0.44–1.00)
GFR, Estimated: >60 mL/min (ref >60)
Glucose, Bld: 165 mg/dL — ABNORMAL HIGH (ref 70–99)
Potassium: 4 mmol/L (ref 3.5–5.1)
Sodium: 137 mmol/L (ref 135–145)
Total Bilirubin: 2.9 mg/dL — ABNORMAL HIGH (ref 0.3–1.2)
Total Protein: 7.4 g/dL (ref 6.5–8.1)

## 2020-06-14 LAB — LIPASE, BLOOD: Lipase: 55 U/L — ABNORMAL HIGH (ref 11–51)

## 2020-06-14 MED ORDER — ONDANSETRON HCL 4 MG/2ML IJ SOLN
4.0000 mg | Freq: Once | INTRAMUSCULAR | Status: AC
  Administered 2020-06-15: 4 mg via INTRAVENOUS
  Filled 2020-06-14: qty 2

## 2020-06-14 MED ORDER — FENTANYL CITRATE (PF) 100 MCG/2ML IJ SOLN
50.0000 ug | Freq: Once | INTRAMUSCULAR | Status: AC
  Administered 2020-06-15: 50 ug via INTRAVENOUS
  Filled 2020-06-14: qty 2

## 2020-06-14 MED ORDER — PIPERACILLIN-TAZOBACTAM 3.375 G IVPB 30 MIN
3.3750 g | Freq: Once | INTRAVENOUS | Status: AC
  Administered 2020-06-15: 3.375 g via INTRAVENOUS
  Filled 2020-06-14: qty 50

## 2020-06-14 MED ORDER — LACTATED RINGERS IV SOLN: INTRAVENOUS | Status: DC

## 2020-06-14 NOTE — ED Notes (Signed)
Patient arrives from group home off [address]. Pt unable to recall name of group home and Ems unable to report name in hand off

## 2020-06-14 NOTE — ED Provider Notes (Signed)
Ultrasound consistent with choledocholithiasis but no evidence of acute cholecystitis.  Patient still tender to palpation the right upper quadrant.  Discussed with Dr. Allen Norris from GI who recommended admission to the hospitalist service, n.p.o., IV hydration, IV antibiotics for an ERCP in the morning.   I have personally reviewed the images performed during this visit and I agree with the Radiologist's read.   Interpretation by Radiologist:  US Abdomen Limited RUQ (LIVER/GB)  Result Date: 06/14/2020 CLINICAL DATA:  Epigastric pain EXAM: ULTRASOUND ABDOMEN LIMITED RIGHT UPPER QUADRANT COMPARISON:  None. FINDINGS: Gallbladder: Gallbladder is well distended with multiple gallstones. No wall thickening or pericholecystic fluid is noted. Negative sonographic Murphy's sign is elicited. Common bile duct: Diameter: 8.8 mm Liver: Echogenic foci are noted within the liver consistent with hemangiomas. Portal vein is patent on color Doppler imaging with normal direction of blood flow towards the liver. Note is made of intrahepatic ductal dilatation. Other: 1.5 cm cystic lesion is noted in the head of the pancreas which appears simple in nature. IMPRESSION: Cholelithiasis without evidence of acute cholecystitis. Dilated common bile duct and intrahepatic ductal system suspicious for distal CBD stone. Multiple hyperechoic lesions in the liver consistent with hemangiomas. Cystic area in the head of the pancreas. Outpatient MRI is recommended for further evaluation to allow for optimum imaging. Electronically Signed   By: Inez Catalina M.D.   On: 06/14/2020 23:28      Rudene Re, MD 06/14/20 2337

## 2020-06-14 NOTE — ED Notes (Signed)
MD at bedside to update patient and patient's family.  

## 2020-06-14 NOTE — ED Notes (Addendum)
Pts sister, [facility], is leaving bedside at this time, updated on plan and consenting to admission plan at this time. Phone number in chart is correct.

## 2020-06-14 NOTE — ED Provider Notes (Signed)
[facility] Emergency Department Provider Note   ____________________________________________   I have reviewed the triage vital signs and the nursing notes.   HISTORY  Chief Complaint Nausea and Emesis   History limited by: Not Limited   HPI Tina Holden is a 70 y.o. female who presents to the emergency department today because of concern for nausea and vomiting. The patient states that the symptoms started today. Says that yesterday she had no nausea. She has had a couple episodes of non bloody emesis. The patient says that she has had accompanied periumbilical abdominal pain. She had an episode of diarrhea. No fevers. Denies similar symptoms in the past.    Records reviewed. Per medical record review patient has a history of DM, schizophrenia.   Past Medical History:  Diagnosis Date  . Arthritis   . Diabetes mellitus without complication ([place])   . Schizophrenia ([place])   . Seizures [facility])     Patient Active Problem List   Diagnosis Date Noted  . Acute cystitis without hematuria 12/14/2014  . Leukocytosis 12/14/2014  . Schizophrenia ([place]) 12/13/2014  . Acute delirium 12/13/2014  . Developmental disability 12/13/2014  . Seizure ([place]) 12/11/2014    Past Surgical History:  Procedure Laterality Date  . ABDOMINAL HYSTERECTOMY    . BREAST BIOPSY Left 2013,2015   neg- core  . BREAST BIOPSY Left 09/02/2017   affirm stereo biopsy/ path pending    Prior to Admission medications   Medication Sig Start Date End Date Taking? Authorizing Provider  benztropine (COGENTIN) 1 MG tablet Take 1 mg by mouth 2 (two) times daily.    [provider]  calcium-vitamin D (OSCAL WITH D) 500-200 MG-UNIT per tablet Take 1 tablet by mouth 2 (two) times daily.    [provider]  cephALEXin (KEFLEX) 250 MG capsule Take 1 capsule (250 mg total) by mouth 3 (three) times daily. 12/14/14   Theodoro Grist, MD  Cholecalciferol 5000 UNITS capsule Take  5,000 Units by mouth daily.    [provider]  gabapentin (NEURONTIN) 300 MG capsule Take 300 mg by mouth 2 (two) times daily.    [provider]  levETIRAcetam (KEPPRA) 500 MG tablet Take 1 tablet (500 mg total) by mouth 2 (two) times daily. 12/14/14   Theodoro Grist, MD  nystatin (MYCOSTATIN/NYSTOP) 100000 UNIT/GM POWD Apply 1 Bottle topically 2 (two) times daily as needed. Apply a small amount    [provider]  polyethylene glycol (MIRALAX / GLYCOLAX) packet Take 17 g by mouth daily as needed for mild constipation.    [provider]  QUEtiapine (SEROQUEL) 25 MG tablet Take 1 tablet (25 mg total) by mouth 2 (two) times daily. 12/14/14   Theodoro Grist, MD  ranitidine (ZANTAC) 150 MG tablet Take 150 mg by mouth at bedtime.    [provider]  thiothixene (NAVANE) 5 MG capsule Take 5 mg by mouth at bedtime.    [provider]  traZODone (DESYREL) 100 MG tablet Take 100 mg by mouth at bedtime as needed for sleep.    [provider]  triamcinolone cream (KENALOG) 0.1 % Apply 1 application topically daily as needed.    [provider]  vitamin B-12 (CYANOCOBALAMIN) 1000 MCG tablet Take 1,000 mcg by mouth daily.    [provider]    Allergies Asa [aspirin], Chocolate flavor, Codeine, and Dilantin [phenytoin sodium extended]  Family History  Problem Relation Age of Onset  . Breast cancer Maternal Aunt   . Breast  cancer Maternal Grandmother     Social History Social History   Tobacco Use  . Smoking status: Never Smoker  . Smokeless tobacco: Never Used  Substance Use Topics  . Alcohol use: No  . Drug use: No    Review of Systems Constitutional: No fever/chills Eyes: No visual changes. ENT: No sore throat. Cardiovascular: Denies chest pain. Respiratory: Denies shortness of breath. Gastrointestinal: Positive for abdominal pain, nausea vomiting and diarrhea. Genitourinary: Negative for  dysuria. Musculoskeletal: Negative for back pain. Skin: Negative for rash. Neurological: Negative for headaches, focal weakness or numbness.  ____________________________________________   PHYSICAL EXAM:  VITAL SIGNS: ED Triage Vitals  Enc Vitals Group     BP 06/14/20 2011 (!) 162/77     Pulse Rate 06/14/20 2011 60     Resp 06/14/20 2011 18     Temp 06/14/20 2011 98.1 F (36.7 C)     Temp Source 06/14/20 2011 Oral     SpO2 06/14/20 2011 100 %     Weight 06/14/20 2010 170 lb 11.2 oz (77.4 kg)     Height 06/14/20 2010 5\' 7"  (1.702 m)     Head Circumference --      Peak Flow --      Pain Score 06/14/20 2010 0   Constitutional: Alert and oriented.  Eyes: Conjunctivae are normal.  ENT      Head: Normocephalic and atraumatic.      Nose: No congestion/rhinnorhea.      Mouth/Throat: Mucous membranes are moist.      Neck: No stridor. Hematological/Lymphatic/Immunilogical: No cervical lymphadenopathy. Cardiovascular: Normal rate, regular rhythm.  No murmurs, rubs, or gallops.  Respiratory: Normal respiratory effort without tachypnea nor retractions. Breath sounds are clear and equal bilaterally. No wheezes/rales/rhonchi. Gastrointestinal: Soft and minimally tender in the epigastric region. Genitourinary: Deferred Musculoskeletal: Normal range of motion in all extremities. No lower extremity edema. Neurologic:  Normal speech and language. No gross focal neurologic deficits are appreciated.  Skin:  Skin is warm, dry and intact. No rash noted. Psychiatric: Mood and affect are normal. Speech and behavior are normal. Patient exhibits appropriate insight and judgment.  ____________________________________________    LABS (pertinent positives/negatives)  Lipase 55 CMP na 137, k 4.0, glu 165, cr 0.80, ast 203, alt 132, t bili 2.9 CBC wbc 13.0, hgb 15.0 ____________________________________________   EKG  None  ____________________________________________    RADIOLOGY  [place]  pending ____________________________________________   PROCEDURES  Procedures  ____________________________________________   INITIAL IMPRESSION / ASSESSMENT AND PLAN / ED COURSE  Pertinent labs & imaging results that were available during my care of the patient were reviewed by me and considered in my medical decision making (see chart for details).   Patient presented to the emergency department today because of concerns for nausea vomiting and some abdominal discomfort.  On exam patient had some minimal tenderness in the epigastric region.  Blood work was notable for elevated lipase as well as some elevation of the hepatic function panel.  Given the location of the pain a right upper quadrant ultrasound was ordered.  ____________________________________________   FINAL CLINICAL IMPRESSION(S) / ED DIAGNOSES  Final diagnoses:  Epigastric abdominal pain     Note: This dictation was prepared with Dragon dictation. Any transcriptional errors that result from this process are unintentional     Nance Pear, MD 06/14/20 2330

## 2020-06-14 NOTE — ED Notes (Signed)
[place] complete. Pt resting in bed with family remaining at bedside.

## 2020-06-14 NOTE — ED Triage Notes (Signed)
Nausea since this AM with 1 episode watery emesis. No medications taken at home or en route. Pt alert and oriented, breathing unlabored. Denies pain or fever.

## 2020-06-15 ENCOUNTER — Inpatient Hospital Stay: Payer: Medicare Other | Admitting: Anesthesiology

## 2020-06-15 ENCOUNTER — Inpatient Hospital Stay: Payer: Medicare Other

## 2020-06-15 ENCOUNTER — Encounter: Payer: Self-pay | Admitting: Internal Medicine

## 2020-06-15 ENCOUNTER — Encounter
Admission: EM | Disposition: A | Discharge status: Home Health | Payer: Self-pay | Source: Skilled Nursing Facility | Attending: Internal Medicine

## 2020-06-15 DIAGNOSIS — I10 Essential (primary) hypertension: Secondary | ICD-10-CM | POA: Diagnosis present

## 2020-06-15 DIAGNOSIS — Z79891 Long term (current) use of opiate analgesic: Secondary | ICD-10-CM | POA: Diagnosis not present

## 2020-06-15 DIAGNOSIS — K8013 Calculus of gallbladder with acute and chronic cholecystitis with obstruction: Secondary | ICD-10-CM | POA: Diagnosis present

## 2020-06-15 DIAGNOSIS — D1809 Hemangioma of other sites: Secondary | ICD-10-CM | POA: Diagnosis present

## 2020-06-15 DIAGNOSIS — K8050 Calculus of bile duct without cholangitis or cholecystitis without obstruction: Secondary | ICD-10-CM | POA: Diagnosis present

## 2020-06-15 DIAGNOSIS — K8012 Calculus of gallbladder with acute and chronic cholecystitis without obstruction: Secondary | ICD-10-CM | POA: Diagnosis not present

## 2020-06-15 DIAGNOSIS — Z885 Allergy status to narcotic agent status: Secondary | ICD-10-CM | POA: Diagnosis not present

## 2020-06-15 DIAGNOSIS — R29818 Other symptoms and signs involving the nervous system: Secondary | ICD-10-CM | POA: Diagnosis not present

## 2020-06-15 DIAGNOSIS — Z20822 Contact with and (suspected) exposure to covid-19: Secondary | ICD-10-CM | POA: Diagnosis present

## 2020-06-15 DIAGNOSIS — F79 Unspecified intellectual disabilities: Secondary | ICD-10-CM | POA: Diagnosis present

## 2020-06-15 DIAGNOSIS — Z888 Allergy status to other drugs, medicaments and biological substances status: Secondary | ICD-10-CM | POA: Diagnosis not present

## 2020-06-15 DIAGNOSIS — G40909 Epilepsy, unspecified, not intractable, without status epilepticus: Secondary | ICD-10-CM | POA: Diagnosis present

## 2020-06-15 DIAGNOSIS — K862 Cyst of pancreas: Secondary | ICD-10-CM | POA: Diagnosis present

## 2020-06-15 DIAGNOSIS — R17 Unspecified jaundice: Secondary | ICD-10-CM | POA: Diagnosis not present

## 2020-06-15 DIAGNOSIS — F209 Schizophrenia, unspecified: Secondary | ICD-10-CM | POA: Diagnosis present

## 2020-06-15 DIAGNOSIS — Z886 Allergy status to analgesic agent status: Secondary | ICD-10-CM | POA: Diagnosis not present

## 2020-06-15 DIAGNOSIS — E1165 Type 2 diabetes mellitus with hyperglycemia: Secondary | ICD-10-CM | POA: Diagnosis present

## 2020-06-15 DIAGNOSIS — Z90710 Acquired absence of both cervix and uterus: Secondary | ICD-10-CM | POA: Diagnosis not present

## 2020-06-15 DIAGNOSIS — Z91018 Allergy to other foods: Secondary | ICD-10-CM | POA: Diagnosis not present

## 2020-06-15 DIAGNOSIS — M1909 Primary osteoarthritis, other specified site: Secondary | ICD-10-CM | POA: Diagnosis present

## 2020-06-15 DIAGNOSIS — Z79899 Other long term (current) drug therapy: Secondary | ICD-10-CM | POA: Diagnosis not present

## 2020-06-15 DIAGNOSIS — E119 Type 2 diabetes mellitus without complications: Secondary | ICD-10-CM

## 2020-06-15 DIAGNOSIS — D131 Benign neoplasm of stomach: Secondary | ICD-10-CM | POA: Diagnosis not present

## 2020-06-15 HISTORY — PX: ENDOSCOPIC RETROGRADE CHOLANGIOPANCREATOGRAPHY (ERCP) WITH PROPOFOL: SHX5810

## 2020-06-15 LAB — COMPREHENSIVE METABOLIC PANEL
ALT: 252 U/L — ABNORMAL HIGH (ref 0–44)
ALT: 284 U/L — ABNORMAL HIGH (ref 0–44)
AST: 284 U/L — ABNORMAL HIGH (ref 15–41)
AST: 277 U/L — ABNORMAL HIGH (ref 15–41)
Albumin: 4 g/dL (ref 3.5–5.0)
Albumin: 3.7 g/dL (ref 3.5–5.0)
Alkaline Phosphatase: 91 U/L (ref 38–126)
Alkaline Phosphatase: 93 U/L (ref 38–126)
Anion gap: 10 (ref 5–15)
Anion gap: 10 (ref 5–15)
BUN: 14 mg/dL (ref 8–23)
BUN: 13 mg/dL (ref 8–23)
CO2: 24 mmol/L (ref 22–32)
CO2: 21 mmol/L — ABNORMAL LOW (ref 22–32)
Calcium: 9.3 mg/dL (ref 8.9–10.3)
Calcium: 8.8 mg/dL — ABNORMAL LOW (ref 8.9–10.3)
Chloride: 103 mmol/L (ref 98–111)
Chloride: 105 mmol/L (ref 98–111)
Creatinine, Ser: 0.73 mg/dL (ref 0.44–1.00)
Creatinine, Ser: 0.73 mg/dL (ref 0.44–1.00)
GFR, Estimated: >60 mL/min (ref >60)
GFR, Estimated: >60 mL/min (ref >60)
Glucose, Bld: 124 mg/dL — ABNORMAL HIGH (ref 70–99)
Glucose, Bld: 118 mg/dL — ABNORMAL HIGH (ref 70–99)
Potassium: 4.5 mmol/L (ref 3.5–5.1)
Potassium: 4.2 mmol/L (ref 3.5–5.1)
Sodium: 137 mmol/L (ref 135–145)
Sodium: 136 mmol/L (ref 135–145)
Total Bilirubin: 4.2 mg/dL — ABNORMAL HIGH (ref 0.3–1.2)
Total Bilirubin: 5.7 mg/dL — ABNORMAL HIGH (ref 0.3–1.2)
Total Protein: 6.6 g/dL (ref 6.5–8.1)
Total Protein: 6.4 g/dL — ABNORMAL LOW (ref 6.5–8.1)

## 2020-06-15 LAB — GLUCOSE, CAPILLARY
Glucose-Capillary: 132 mg/dL — ABNORMAL HIGH (ref 70–99)
Glucose-Capillary: 111 mg/dL — ABNORMAL HIGH (ref 70–99)
Glucose-Capillary: 116 mg/dL — ABNORMAL HIGH (ref 70–99)
Glucose-Capillary: 141 mg/dL — ABNORMAL HIGH (ref 70–99)

## 2020-06-15 LAB — URINALYSIS, ROUTINE W REFLEX MICROSCOPIC
Bilirubin Urine: NEGATIVE
Glucose, UA: NEGATIVE mg/dL
Hgb urine dipstick: NEGATIVE
Ketones, ur: NEGATIVE mg/dL
Leukocytes,Ua: NEGATIVE
Nitrite: NEGATIVE
Protein, ur: 30 mg/dL — AB
Specific Gravity, Urine: 1.023 (ref 1.005–1.030)
pH: 7 (ref 5.0–8.0)

## 2020-06-15 LAB — HEMOGLOBIN A1C
Hgb A1c MFr Bld: 5.2 % (ref 4.8–5.6)
Mean Plasma Glucose: 102.54 mg/dL

## 2020-06-15 LAB — TROPONIN I (HIGH SENSITIVITY): Troponin I (High Sensitivity): <2 ng/L (ref <18)

## 2020-06-15 LAB — CBC
HCT: 42.8 % (ref 36.0–46.0)
Hemoglobin: 14.1 g/dL (ref 12.0–15.0)
MCH: 30.3 pg (ref 26.0–34.0)
MCHC: 32.9 g/dL (ref 30.0–36.0)
MCV: 92 fL (ref 80.0–100.0)
Platelets: 253 10*3/uL (ref 150–400)
RBC: 4.65 MIL/uL (ref 3.87–5.11)
RDW: 12.9 % (ref 11.5–15.5)
WBC: 16.6 10*3/uL — ABNORMAL HIGH (ref 4.0–10.5)
nRBC: 0 % (ref 0.0–0.2)

## 2020-06-15 LAB — RESP PANEL BY RT-PCR (FLU A&B, COVID) ARPGX2
Influenza A by PCR: NEGATIVE
Influenza B by PCR: NEGATIVE
SARS Coronavirus 2 by RT PCR: NEGATIVE

## 2020-06-15 SURGERY — ENDOSCOPIC RETROGRADE CHOLANGIOPANCREATOGRAPHY (ERCP) WITH PROPOFOL
Anesthesia: General

## 2020-06-15 MED ORDER — GLYCOPYRROLATE 0.2 MG/ML IJ SOLN
INTRAMUSCULAR | Status: AC
  Filled 2020-06-15: qty 1

## 2020-06-15 MED ORDER — FENTANYL CITRATE (PF) 100 MCG/2ML IJ SOLN
INTRAMUSCULAR | Status: DC | PRN
  Administered 2020-06-15: 50 ug via INTRAVENOUS

## 2020-06-15 MED ORDER — SUCCINYLCHOLINE CHLORIDE 200 MG/10ML IV SOSY
PREFILLED_SYRINGE | INTRAVENOUS | Status: AC
  Filled 2020-06-15: qty 10

## 2020-06-15 MED ORDER — SEVOFLURANE IN SOLN
RESPIRATORY_TRACT | Status: AC
  Filled 2020-06-15: qty 250

## 2020-06-15 MED ORDER — PROPOFOL 10 MG/ML IV BOLUS
INTRAVENOUS | Status: AC
  Filled 2020-06-15: qty 20

## 2020-06-15 MED ORDER — DEXAMETHASONE SODIUM PHOSPHATE 10 MG/ML IJ SOLN
INTRAMUSCULAR | Status: DC | PRN
  Administered 2020-06-15: 10 mg via INTRAVENOUS

## 2020-06-15 MED ORDER — PROPOFOL 10 MG/ML IV BOLUS
INTRAVENOUS | Status: DC | PRN
  Administered 2020-06-15: 130 mg via INTRAVENOUS
  Administered 2020-06-15: 120 mg via INTRAVENOUS

## 2020-06-15 MED ORDER — ONDANSETRON HCL 4 MG/2ML IJ SOLN
INTRAMUSCULAR | Status: AC
  Filled 2020-06-15: qty 2

## 2020-06-15 MED ORDER — INDOCYANINE GREEN 25 MG IV SOLR: 2.5000 mg | Freq: Once | INTRAVENOUS | Status: DC

## 2020-06-15 MED ORDER — ONDANSETRON HCL 4 MG/2ML IJ SOLN
INTRAMUSCULAR | Status: DC | PRN
  Administered 2020-06-15: 4 mg via INTRAVENOUS

## 2020-06-15 MED ORDER — INSULIN ASPART 100 UNIT/ML ~~LOC~~ SOLN: 0.0000 [IU] | Freq: Every day | SUBCUTANEOUS | Status: DC

## 2020-06-15 MED ORDER — METOPROLOL TARTRATE 5 MG/5ML IV SOLN: 5.0000 mg | Freq: Four times a day (QID) | INTRAVENOUS | Status: DC | PRN

## 2020-06-15 MED ORDER — LACTATED RINGERS IV SOLN: INTRAVENOUS | Status: DC

## 2020-06-15 MED ORDER — LIDOCAINE HCL (CARDIAC) PF 100 MG/5ML IV SOSY
PREFILLED_SYRINGE | INTRAVENOUS | Status: DC | PRN
  Administered 2020-06-15 (×2): 50 mg via INTRAVENOUS

## 2020-06-15 MED ORDER — SODIUM CHLORIDE 0.9 % IV SOLN
1.0000 g | Freq: Three times a day (TID) | INTRAVENOUS | Status: DC
  Administered 2020-06-15 – 2020-06-17 (×6): 1 g via INTRAVENOUS
  Filled 2020-06-15 (×9): qty 1

## 2020-06-15 MED ORDER — ONDANSETRON HCL 4 MG/2ML IJ SOLN: 4.0000 mg | Freq: Once | INTRAMUSCULAR | Status: DC | PRN

## 2020-06-15 MED ORDER — HYDROMORPHONE HCL 1 MG/ML IJ SOLN: 0.5000 mg | INTRAMUSCULAR | Status: DC | PRN

## 2020-06-15 MED ORDER — LACTATED RINGERS IV SOLN: INTRAVENOUS | Status: DC | PRN

## 2020-06-15 MED ORDER — SODIUM CHLORIDE 0.9 % IV SOLN
500.0000 mg | Freq: Three times a day (TID) | INTRAVENOUS | Status: DC
  Filled 2020-06-15 (×2): qty 0.5

## 2020-06-15 MED ORDER — DEXAMETHASONE SODIUM PHOSPHATE 10 MG/ML IJ SOLN
INTRAMUSCULAR | Status: AC
  Filled 2020-06-15: qty 1

## 2020-06-15 MED ORDER — FENTANYL CITRATE (PF) 100 MCG/2ML IJ SOLN
INTRAMUSCULAR | Status: AC
  Filled 2020-06-15: qty 2

## 2020-06-15 MED ORDER — FENTANYL CITRATE (PF) 100 MCG/2ML IJ SOLN: 25.0000 ug | INTRAMUSCULAR | Status: DC | PRN

## 2020-06-15 MED ORDER — INSULIN ASPART 100 UNIT/ML ~~LOC~~ SOLN: 0.0000 [IU] | Freq: Three times a day (TID) | SUBCUTANEOUS | Status: DC

## 2020-06-15 NOTE — ED Notes (Signed)
RN called floor to request purple man assignment. Floor RN reports they will inform charge.

## 2020-06-15 NOTE — Transfer of Care (Signed)
Immediate Anesthesia Transfer of Care Note  Patient: Tina Holden  Procedure(s) Performed: ENDOSCOPIC RETROGRADE CHOLANGIOPANCREATOGRAPHY (ERCP) WITH PROPOFOL (N/A )  Patient Location: PACU  Anesthesia Type:General  Level of Consciousness: drowsy  Airway & Oxygen Therapy: Patient connected to face mask oxygen  Post-op Assessment: Report given to RN and Post -op Vital signs reviewed and stable  Post vital signs: Reviewed and stable  Last Vitals:  Vitals Value Taken Time  BP 127/80 06/15/20 1304  Temp    Pulse 75 06/15/20 1309  Resp 16 06/15/20 1309  SpO2 100 % 06/15/20 1309  Vitals shown include unvalidated device data.  Last Pain:  Vitals:   06/15/20 0942  TempSrc: Oral  PainSc: 0-No pain         Complications: No complications documented.

## 2020-06-15 NOTE — Progress Notes (Signed)
  PROGRESS NOTE    Tina Holden  BVA:[id number] DOB: Mar 03, 1951 DOA: 06/14/2020  PCP: Derinda Late, MD    LOS - 0    Patient admitted overnight with choledocholithiasis.  Interval subjective: Reports feeling somewhat better this AM.  No abdominal pain currently.  Denies nausea/vomiting but had some earlier this AM.  No other acute complaints.    Exam: no distress, awake & alert.  Abdomen mildly tender on RUQ/epigastric palpation, lungs clear, heart sounds RRR, no peripheral edema   Principal Problem:   Choledocholithiasis Active Problems:   Seizure (H[facility])   Schizophrenia ([facility])   Developmental disability   Leukocytosis   Diabetes ([place])   Essential hypertension    I have reviewed the full H&P by Dr. Jonelle Sidle in detail, and I agree with the assessment and plan as outlined therein. In addition: --PT evaluation tomorrow to ensure 100% independent for return to her group home   No Charge    Ezekiel Slocumb, DO [facility]   If 7PM-7AM, please contact night-coverage [email or web address] 06/15/2020, 11:43 AM

## 2020-06-15 NOTE — Hospital Course (Addendum)
Tina Holden is a 70 y.o. female with medical history significant of diabetes, schizophrenia, seizure disorder, diabetes not on any medications, osteoarthritis and developmental disability who presented to the ED on evening of 06/14/20 with 1 day of sudden onset of right upper quadrant abdominal pain with some nausea and episode of vomiting.  She was found to have choledocholithiasis.    Admitted to hospitalist service with GI consulted. Underwent ERCP on 3/26 with complete stone removal was successful.  General surgery was consulted and took pt for robotic-assisted lap chole on 3/27.    Treated with IV antibiotics during admission as well.  Postoperatively, patient has done well with pain controlled by Tylenol.   Diet was advance and patient tolerating oral intake well.  She was evaluated by PT and OT, home health therapy was recommended and set up.    General surgery will see the patient in clinic for follow up in about 2 weeks.

## 2020-06-15 NOTE — Consult Note (Signed)
Patient ID: Tina Holden, female   DOB: [birth year], 70 y.o.   MRN: [id number]  HPI Tina Holden is a 70 y.o. female seen in consultation at the request of Dr.Wohl, discussed with him in detail.  She came into the emergency room last night complaining of nausea and vomiting.  Apparently the patient reported some periumbilical pain.  Today's history is taken from family member who is at the bedside.  Patient seems to be nonverbal and thus open her eyes and follows her voices with her eyes but does not interact.  She also had some nausea and vomiting.  She does have a history of seizure disorder and schizophrenia.  She comes from a group home of [employer]. Brother is at the bedside Work-up last night RUQ [place]  Pers. Reviewed. r evealed evidence of enlarged common bile duct with cholelithiasis and likely choledocholithiasis.  No evidence of cholecystitis.  ? Pancreatic head cyst./ SHe does have increase in the AST ALT and a total bilirubin of 4.2.  CBC shows a white count of 16.6 hemoglobin of 14.1 and platelets of 253. He has been evaluated by GI who will plan on doing an ERCP later today   HPI  Past Medical History:  Diagnosis Date  . Arthritis   . Diabetes mellitus without complication ([place])   . Schizophrenia ([place])   . Seizures ([place])     Past Surgical History:  Procedure Laterality Date  . ABDOMINAL HYSTERECTOMY    . BREAST BIOPSY Left 2013,2015   neg- core  . BREAST BIOPSY Left 09/02/2017   affirm stereo biopsy/ path pending    Family History  Problem Relation Age of Onset  . Breast cancer Maternal Aunt   . Breast cancer Maternal Grandmother     Social History Social History   Tobacco Use  . Smoking status: Never Smoker  . Smokeless tobacco: Never Used  Substance Use Topics  . Alcohol use: No  . Drug use: No    Allergies  Allergen Reactions  . Asa [Aspirin] Other (See Comments)    Unknown   . Chocolate Flavor   . Codeine Other (See Comments)     unknown  . Dilantin [Phenytoin Sodium Extended]     Current Facility-Administered Medications  Medication Dose Route Frequency Provider Last Rate Last Admin  . HYDROmorphone (DILAUDID) injection 0.5-1 mg  0.5-1 mg Intravenous Q2H PRN Lucilla Lame, MD      . insulin aspart (novoLOG) injection 0-5 Units  0-5 Units Subcutaneous QHS Allen Norris, Darren, MD      . insulin aspart (novoLOG) injection 0-9 Units  0-9 Units Subcutaneous TID WC Lucilla Lame, MD      . lactated ringers infusion   Intravenous Continuous Lucilla Lame, MD 100 mL/hr at 06/15/20 1435 New Bag at 06/15/20 1435  . meropenem (MERREM) 1 g in sodium chloride 0.9 % 100 mL IVPB  1 g Intravenous Q8H Lucilla Lame, MD 200 mL/hr at 06/15/20 0539 1 g at 06/15/20 0539  . metoprolol tartrate (LOPRESSOR) injection 5 mg  5 mg Intravenous Q6H PRN Lucilla Lame, MD         Review of Systems ROS unable to be obtained due to pt's baseline neurological impairement   Physical Exam Blood pressure (!) 149/77, pulse 64, temperature 99.1 F (37.3 C), temperature source Oral, resp. rate 16, height 5\' 7"  (1.702 m), weight 77.4 kg, SpO2 96 %. CONSTITUTIONAL: debilitated pt, non verbal EYES: Pupils are equal, round, , Sclera are non-icteric. EARS, NOSE, MOUTH AND  THROAT: she is wearing a mask. Hearing is intact to voice. LYMPH NODES:  Lymph nodes in the neck are normal. RESPIRATORY:  Lungs are clear. There is normal respiratory effort, with equal breath sounds bilaterally, and without pathologic use of accessory muscles. CARDIOVASCULAR: Heart is regular without murmurs, gallops, or rubs. GI: The abdomen is  soft, nontender, and nondistended. There are no palpable masses. There is no hepatosplenomegaly. There are normal bowel sounds in all quadrants. GU: Rectal deferred.   MUSCULOSKELETAL: Normal muscle strength and tone. No cyanosis or edema.   SKIN: Turgor is good and there are no pathologic skin lesions or ulcers. NEUROLOGIC: pt is non verbal and does not  follow commands, she is awake and alert and open her eyes and follows voices   Data Reviewed  I have personally reviewed the patient's imaging, laboratory findings and medical records.    Assessment/Plan Choledocholithiasis with obstructive jaundice.  Patient will go for an ERCP today.  We will tentatively place her on the schedule for tomorrow for robotic cholecystectomy.I discussed the procedure in detail.  The patient's family was given Neurosurgeon.  We discussed the risks and benefits of a laparoscopic cholecystectomy and possible cholangiogram including, but not limited to bleeding, infection, injury to surrounding structures such as the intestine or liver, bile leak, retained gallstones, need to convert to an open procedure, prolonged diarrhea, blood clots such as  DVT, common bile duct injury, anesthesia risks, and possible need for additional procedures.  The likelihood of improvement in symptoms and return to the patient's baseline status is good. We discussed the typical post-operative recovery course. To think that the brother is going to sign the consent as he is doing well that is available here.   Caroleen Hamman, MD FACS General Surgeon 06/15/2020, 3:00 PM

## 2020-06-15 NOTE — Anesthesia Postprocedure Evaluation (Signed)
Anesthesia Post Note  Patient: Tina Holden  Procedure(s) Performed: ENDOSCOPIC RETROGRADE CHOLANGIOPANCREATOGRAPHY (ERCP) WITH PROPOFOL (N/A )  Patient location during evaluation: PACU Anesthesia Type: General Level of consciousness: awake and alert Pain management: pain level controlled Vital Signs Assessment: post-procedure vital signs reviewed and stable Respiratory status: spontaneous breathing, nonlabored ventilation, respiratory function stable and patient connected to nasal cannula oxygen Cardiovascular status: blood pressure returned to baseline and stable Postop Assessment: no apparent nausea or vomiting Anesthetic complications: no   No complications documented.   Last Vitals:  Vitals:   06/15/20 1345 06/15/20 1436  BP: 128/63 (!) 149/77  Pulse: 70 64  Resp: 17 16  Temp: 36.7 C 37.3 C  SpO2: 96% 96%    Last Pain:  Vitals:   06/15/20 1436  TempSrc: Oral  PainSc:                  Martha Clan

## 2020-06-15 NOTE — Anesthesia Procedure Notes (Signed)
Procedure Name: Intubation Date/Time: 06/15/2020 12:30 PM Performed by: Doreen Salvage, CRNA Pre-anesthesia Checklist: Patient identified, Patient being monitored, Timeout performed, Emergency Drugs available and Suction available Patient Re-evaluated:Patient Re-evaluated prior to induction Oxygen Delivery Method: Circle system utilized Preoxygenation: Pre-oxygenation with 100% oxygen Induction Type: IV induction Ventilation: Mask ventilation without difficulty Laryngoscope Size: Mac, 3 and McGraph Grade View: Grade I Tube type: Oral Tube size: 7.0 mm Number of attempts: 1 Airway Equipment and Method: Stylet Placement Confirmation: ETT inserted through vocal cords under direct vision,  positive ETCO2 and breath sounds checked- equal and bilateral Secured at: 19 (at the gums) cm Tube secured with: Tape Dental Injury: Teeth and Oropharynx as per pre-operative assessment

## 2020-06-15 NOTE — Progress Notes (Signed)
New password

## 2020-06-15 NOTE — Consult Note (Signed)
Lucilla Lame, MD [facility]  [address] [place], [place] [address] Phone: [phone] Fax : [phone]  Consultation  Referring Provider:     Dr. Arbutus Ped Primary Care Physician:  Derinda Late, MD Primary Gastroenterologist: Althia Forts    Reason for Consultation:     Choledocholithiasis  Date of Admission:  06/14/2020 Date of Consultation:  06/15/2020         HPI:   Tina Holden is a 70 y.o. female who came to the emergency room for nausea vomiting.  The patient had started the symptoms earlier in the day and with a couple of episodes of vomiting with some right-sided abdominal pain.  Patient had an ultrasound of the abdomen that showed:  IMPRESSION: Cholelithiasis without evidence of acute cholecystitis. Dilated common bile duct and intrahepatic ductal system suspicious for distal CBD stone. Multiple hyperechoic lesions in the liver consistent with hemangiomas. Cystic area in the head of the pancreas. Outpatient MRI is recommended for further evaluation to allow for optimum imaging.  Most of the information about the patient is gotten from the brother.  The patient has a history of diabetes schizophrenia seizure disorders and developmental disabilities.  I am not being asked to see the patient for choledocholithiasis and an ERCP.  Past Medical History:  Diagnosis Date  . Arthritis   . Diabetes mellitus without complication ([place])   . Schizophrenia ([place])   . Seizures ([place])     Past Surgical History:  Procedure Laterality Date  . ABDOMINAL HYSTERECTOMY    . BREAST BIOPSY Left 2013,2015   neg- core  . BREAST BIOPSY Left 09/02/2017   affirm stereo biopsy/ path pending    Prior to Admission medications   Medication Sig Start Date End Date Taking? Authorizing Provider  benztropine (COGENTIN) 1 MG tablet Take 1 mg by mouth 2 (two) times daily.    [provider]  calcium-vitamin D (OSCAL WITH D) 500-200 MG-UNIT per tablet Take 1 tablet  by mouth 2 (two) times daily.    [provider]  cephALEXin (KEFLEX) 250 MG capsule Take 1 capsule (250 mg total) by mouth 3 (three) times daily. 12/14/14   Theodoro Grist, MD  Cholecalciferol 5000 UNITS capsule Take 5,000 Units by mouth daily.    [provider]  gabapentin (NEURONTIN) 300 MG capsule Take 300 mg by mouth 2 (two) times daily.    [provider]  levETIRAcetam (KEPPRA) 500 MG tablet Take 1 tablet (500 mg total) by mouth 2 (two) times daily. 12/14/14   Theodoro Grist, MD  nystatin (MYCOSTATIN/NYSTOP) 100000 UNIT/GM POWD Apply 1 Bottle topically 2 (two) times daily as needed. Apply a small amount    [provider]  polyethylene glycol (MIRALAX / GLYCOLAX) packet Take 17 g by mouth daily as needed for mild constipation.    [provider]  QUEtiapine (SEROQUEL) 25 MG tablet Take 1 tablet (25 mg total) by mouth 2 (two) times daily. 12/14/14   Theodoro Grist, MD  ranitidine (ZANTAC) 150 MG tablet Take 150 mg by mouth at bedtime.    [provider]  thiothixene (NAVANE) 5 MG capsule Take 5 mg by mouth at bedtime.    [provider]  traZODone (DESYREL) 100 MG tablet Take 100 mg by mouth at bedtime as needed for sleep.    [provider]  triamcinolone cream (KENALOG) 0.1 % Apply 1 application topically daily as needed.    [provider]  vitamin B-12 (CYANOCOBALAMIN) 1000 MCG tablet Take 1,000 mcg  by mouth daily.    [provider]    Family History  Problem Relation Age of Onset  . Breast cancer Maternal Aunt   . Breast cancer Maternal Grandmother      Social History   Tobacco Use  . Smoking status: Never Smoker  . Smokeless tobacco: Never Used  Substance Use Topics  . Alcohol use: No  . Drug use: No    Allergies as of 06/14/2020 - Review Complete 06/14/2020  Allergen Reaction Noted  . Asa [aspirin] Other (See Comments) 12/11/2014  . Chocolate flavor  12/11/2014  . Codeine Other  (See Comments) 12/11/2014  . Dilantin [phenytoin sodium extended]  12/11/2014    Review of Systems:    All systems reviewed and negative except where noted in HPI.   Physical Exam:  Vital signs in last 24 hours: Temp:  [98.1 F (36.7 C)-100.3 F (37.9 C)] 98.4 F (36.9 C) (03/26 0942) Pulse Rate:  [60-70] 66 (03/26 0942) Resp:  [16-27] 20 (03/26 0942) BP: (134-162)/(69-79) 134/69 (03/26 0942) SpO2:  [94 %-100 %] 96 % (03/26 0942) Weight:  [77.4 kg] 77.4 kg (03/25 2010) Last BM Date: 06/14/20 General:   Pleasant, cooperative in NAD Head:  Normocephalic and atraumatic. Eyes:   No icterus.   Conjunctiva pink. PERRLA. Ears:  Normal auditory acuity. Neck:  Supple; no masses or thyroidomegaly Lungs: Respirations even and unlabored. Lungs clear to auscultation bilaterally.   No wheezes, crackles, or rhonchi.  Heart:  Regular rate and rhythm;  Without murmur, clicks, rubs or gallops Abdomen:  Soft, nondistended, tenderness in the epigastric and right upper quadrant.. Normal bowel sounds. No appreciable masses or hepatomegaly.  No rebound or guarding.  Rectal:  Not performed. Msk:  Symmetrical without gross deformities.    Extremities:  Without edema, cyanosis or clubbing. Neurologic:  Alert and oriented x3;  grossly normal neurologically. Skin:  Intact without significant lesions or rashes. Cervical Nodes:  No significant cervical adenopathy. Psych:  Alert and cooperative. Normal affect.  LAB RESULTS: Recent Labs    06/14/20 2020 06/15/20 0513  WBC 13.0* 16.6*  HGB 15.0 14.1  HCT 46.3* 42.8  PLT 194 253   BMET Recent Labs    06/14/20 2020 06/15/20 0513 06/15/20 1036  NA 137 137 136  K 4.0 4.5 4.2  CL 101 103 105  CO2 24 24 21*  GLUCOSE 165* 124* 118*  BUN 16 14 13   CREATININE 0.80 0.73 0.73  CALCIUM 9.4 9.3 8.8*   LFT Recent Labs    06/15/20 1036  PROT 6.4*  ALBUMIN 3.7  AST 277*  ALT 284*  ALKPHOS 93  BILITOT 5.7*   PT/INR No results for input(s):  LABPROT, INR in the last 72 hours.  STUDIES: US Abdomen Limited RUQ (LIVER/GB)  Result Date: 06/14/2020 CLINICAL DATA:  Epigastric pain EXAM: ULTRASOUND ABDOMEN LIMITED RIGHT UPPER QUADRANT COMPARISON:  None. FINDINGS: Gallbladder: Gallbladder is well distended with multiple gallstones. No wall thickening or pericholecystic fluid is noted. Negative sonographic Murphy's sign is elicited. Common bile duct: Diameter: 8.8 mm Liver: Echogenic foci are noted within the liver consistent with hemangiomas. Portal vein is patent on color Doppler imaging with normal direction of blood flow towards the liver. Note is made of intrahepatic ductal dilatation. Other: 1.5 cm cystic lesion is noted in the head of the pancreas which appears simple in nature. IMPRESSION: Cholelithiasis without evidence of acute cholecystitis. Dilated common bile duct and intrahepatic ductal system suspicious for distal CBD stone. Multiple hyperechoic lesions in the  liver consistent with hemangiomas. Cystic area in the head of the pancreas. Outpatient MRI is recommended for further evaluation to allow for optimum imaging. Electronically Signed   By: Inez Catalina M.D.   On: 06/14/2020 23:28      Impression / Plan:   Assessment: Principal Problem:   Choledocholithiasis Active Problems:   Seizure ([place])   Schizophrenia (H[facility])   Developmental disability   Leukocytosis   Diabetes ([place])   Essential hypertension   Tina Holden is a 70 y.o. y/o female with who comes in with choledocholithiasis and elevated liver enzymes.  Patient was noted to have hyperbilirubinemia with her total bilirubin of 5.7 this morning.  The patient was noted to have a cystic lesion in the head of the pancreas and a outpatient MRI was recommended for further valuation as an outpatient.  Plan:  This patient will be set up for an ERCP with stone extraction.  The patient's brother has been explained the risks including pancreatitis bleeding perforation  failure of the procedure and death.  I have also consulted surgery to see the patient for cholecystectomy after stone extraction from the common bile duct hopefully this admission if the patient does not have post ERCP pancreatitis.  The patient and the brother have been explained the plan and agree with it.  Thank you for involving me in the care of this patient.      LOS: 0 days   Lucilla Lame, MD, [facility] 06/15/2020, 12:02 PM,  Pager [phone] 7am-5pm  Check AMION for 5pm -7am coverage and on weekends   Note: This dictation was prepared with Dragon dictation along with smaller phrase technology. Any transcriptional errors that result from this process are unintentional.

## 2020-06-15 NOTE — Anesthesia Preprocedure Evaluation (Addendum)
Anesthesia Evaluation  Patient identified by MRN, date of birth, ID band Patient awake    Reviewed: Allergy & Precautions, H&P , NPO status , Patient's Chart, lab work & pertinent test results, reviewed documented beta blocker date and time   History of Anesthesia Complications Negative for: history of anesthetic complications  Airway Mallampati: II  TM Distance: >3 FB Neck ROM: full    Dental  (+) Dental Advidsory Given, Poor Dentition   Pulmonary neg pulmonary ROS,    Pulmonary exam normal breath sounds clear to auscultation       Cardiovascular Exercise Tolerance: Good negative cardio ROS Normal cardiovascular exam Rhythm:regular Rate:Normal     Neuro/Psych Seizures -, Well Controlled,  PSYCHIATRIC DISORDERS Schizophrenia    GI/Hepatic negative GI ROS, Neg liver ROS,   Endo/Other  diabetes  Renal/GU negative Renal ROS  negative genitourinary   Musculoskeletal   Abdominal   Peds  Hematology negative hematology ROS (+)   Anesthesia Other Findings Past Medical History: No date: Arthritis No date: Diabetes mellitus without complication (HCC) No date: Schizophrenia ([place]) No date: Seizures (HCC)   Reproductive/Obstetrics negative OB ROS                             Anesthesia Physical Anesthesia Plan  ASA: II  Anesthesia Plan: General   Post-op Pain Management:    Induction: Intravenous  PONV Risk Score and Plan: 3 and Ondansetron, Dexamethasone and Midazolam  Airway Management Planned: Oral ETT  Additional Equipment:   Intra-op Plan:   Post-operative Plan: Extubation in OR  Informed Consent: I have reviewed the patients History and Physical, chart, labs and discussed the procedure including the risks, benefits and alternatives for the proposed anesthesia with the patient or authorized representative who has indicated his/her understanding and acceptance.     Dental  Advisory Given  Plan Discussed with: Anesthesiologist, CRNA and Surgeon  Anesthesia Plan Comments: (Discussed risks and benefits of general anesthesia with patient's brother who is signing consents for her as she is unable to sign her own.)      Anesthesia Quick Evaluation

## 2020-06-15 NOTE — H&P (Signed)
History and Physical   Tina Holden XNA:[id number] DOB: [birth year] DOA: 06/14/2020  Referring MD/NP/PA: Dr. Alfred Levins  PCP: Derinda Late, MD   Outpatient Specialists: None  Patient coming from: Home  Chief Complaint: Abdominal pain and nausea vomiting  HPI: Tina Holden is a 70 y.o. female with medical history significant of diabetes, schizophrenia, seizure disorder, diabetes not on any medications, osteoarthritis developmental disability that presents to the ER with 1 day of sudden onset of right upper quadrant abdominal pain with some nausea and episode of vomiting.  Pain rated as 8 out of 10 in the beginning.  No known trigger.  It came suddenly.  Patient was seen in the ER and evaluated.  Work-up showed choledocholithiasis.  GI was consulted and recommended admission with IV antibiotics.  Patient to be prepped for EGD with possible ERCP in the morning.  She denied any fever or chills.  Denied any nausea vomiting or diarrhea.  Patient has been taking her home regimen without a problem.  She is therefore being admitted to the hospital for further evaluation and treatment.  Patient denied any diarrhea.  No hematemesis no melena no bright red blood per rectum..  ED Course: Temperature 98.9 blood pressure 160/77 pulse 66 respirate 27 oxygen sat 98% on room air.  White count 13.0 with rest of CBC and chemistry mostly within normal glucose is 165.  Lipase is 55 AST 203 ALT 132 total bilirubin 2.9 alkaline phosphatase of 93.  Abdominal ultrasound showed cholelithiasis without evidence of acute cholecystitis.  There is dilated common bile duct and intrahepatic ductal system suspicious for distal CBD stone.  There is multiple hypoechoic 8 COVID lesion in the liver consistent with hemangiomas as well as cystic area in the head of the pancreas.  Recommend outpatient MRI.  Patient being admitted after consultation with Dr. Verl Blalock with GI.  Review of Systems: As per HPI otherwise 10  point review of systems negative.    Past Medical History:  Diagnosis Date  . Arthritis   . Diabetes mellitus without complication ([place])   . Schizophrenia ([place])   . Seizures ([place])     Past Surgical History:  Procedure Laterality Date  . ABDOMINAL HYSTERECTOMY    . BREAST BIOPSY Left 2013,2015   neg- core  . BREAST BIOPSY Left 09/02/2017   affirm stereo biopsy/ path pending     reports that she has never smoked. She has never used smokeless tobacco. She reports that she does not drink alcohol and does not use drugs.  Allergies  Allergen Reactions  . Asa [Aspirin] Other (See Comments)    Unknown   . Chocolate Flavor   . Codeine Other (See Comments)    unknown  . Dilantin [Phenytoin Sodium Extended]     Family History  Problem Relation Age of Onset  . Breast cancer Maternal Aunt   . Breast cancer Maternal Grandmother      Prior to Admission medications   Medication Sig Start Date End Date Taking? Authorizing Provider  benztropine (COGENTIN) 1 MG tablet Take 1 mg by mouth 2 (two) times daily.    [provider]  calcium-vitamin D (OSCAL WITH D) 500-200 MG-UNIT per tablet Take 1 tablet by mouth 2 (two) times daily.    [provider]  cephALEXin (KEFLEX) 250 MG capsule Take 1 capsule (250 mg total) by mouth 3 (three) times daily. 12/14/14   Theodoro Grist, MD  Cholecalciferol 5000 UNITS capsule Take 5,000 Units by mouth daily.    [provider]  gabapentin (NEURONTIN) 300 MG capsule Take 300 mg by mouth 2 (two) times daily.    [provider]  levETIRAcetam (KEPPRA) 500 MG tablet Take 1 tablet (500 mg total) by mouth 2 (two) times daily. 12/14/14   Theodoro Grist, MD  nystatin (MYCOSTATIN/NYSTOP) 100000 UNIT/GM POWD Apply 1 Bottle topically 2 (two) times daily as needed. Apply a small amount    [provider]  polyethylene glycol (MIRALAX / GLYCOLAX) packet Take 17 g by mouth daily as needed for mild constipation.    [provider]  QUEtiapine (SEROQUEL) 25 MG tablet Take 1 tablet (25 mg total) by mouth 2 (two) times daily. 12/14/14   Theodoro Grist, MD  ranitidine (ZANTAC) 150 MG tablet Take 150 mg by mouth at bedtime.    [provider]  thiothixene (NAVANE) 5 MG capsule Take 5 mg by mouth at bedtime.    [provider]  traZODone (DESYREL) 100 MG tablet Take 100 mg by mouth at bedtime as needed for sleep.    [provider]  triamcinolone cream (KENALOG) 0.1 % Apply 1 application topically daily as needed.    [provider]  vitamin B-12 (CYANOCOBALAMIN) 1000 MCG tablet Take 1,000 mcg by mouth daily.    [provider]    Physical Exam: Vitals:   06/14/20 2011 06/14/20 2150 06/14/20 2232 06/15/20 0000  BP: (!) 162/77 (!) 153/79  (!) 149/74  Pulse: 60 65 65 66  Resp: 18 17 (!) 27 20  Temp: 98.1 F (36.7 C)     TempSrc: Oral     SpO2: 100% 98% 99% 99%  Weight:      Height:          Constitutional: Acutely ill looking with no distress Vitals:   06/14/20 2011 06/14/20 2150 06/14/20 2232 06/15/20 0000  BP: (!) 162/77 (!) 153/79  (!) 149/74  Pulse: 60 65 65 66  Resp: 18 17 (!) 27 20  Temp: 98.1 F (36.7 C)     TempSrc: Oral     SpO2: 100% 98% 99% 99%  Weight:      Height:       Eyes: PERRL, lids and conjunctivae normal ENMT: Mucous membranes are moist. Posterior pharynx clear of any exudate or lesions.Normal dentition.  Neck: normal, supple, no masses, no thyromegaly Respiratory: clear to auscultation bilaterally, no wheezing, no crackles. Normal respiratory effort. No accessory muscle use.  Cardiovascular: Regular rate and rhythm, no murmurs / rubs / gallops. No extremity edema. 2+ pedal pulses. No carotid bruits.  Abdomen: Right upper quadrant abdominal tenderness, no masses palpated. No hepatosplenomegaly. Bowel sounds positive.  Musculoskeletal: no clubbing / cyanosis. No joint deformity upper and lower extremities. Good ROM, no  contractures. Normal muscle tone.  Skin: no rashes, lesions, ulcers. No induration Neurologic: CN 2-12 grossly intact. Sensation intact, DTR normal. Strength 5/5 in all 4.  Psychiatric: Normal judgment and insight. Alert and oriented x 3. Normal mood.     Labs on Admission: I have personally reviewed following labs and imaging studies  CBC: Recent Labs  Lab 06/14/20 2020  WBC 13.0*  NEUTROABS 11.9*  HGB 15.0  HCT 46.3*  MCV 92.4  PLT 973   Basic Metabolic Panel: Recent Labs  Lab 06/14/20 2020  NA 137  K 4.0  CL 101  CO2 24  GLUCOSE 165*  BUN 16  CREATININE 0.80  CALCIUM 9.4   GFR: Estimated Creatinine Clearance: 69.1 mL/min (by C-G formula based on SCr of  0.8 mg/dL). Liver Function Tests: Recent Labs  Lab 06/14/20 2020  AST 203*  ALT 132*  ALKPHOS 93  BILITOT 2.9*  PROT 7.4  ALBUMIN 4.7   Recent Labs  Lab 06/14/20 2020  LIPASE 55*   No results for input(s): AMMONIA in the last 168 hours. Coagulation Profile: No results for input(s): INR, PROTIME in the last 168 hours. Cardiac Enzymes: No results for input(s): CKTOTAL, CKMB, CKMBINDEX, TROPONINI in the last 168 hours. BNP (last 3 results) No results for input(s): PROBNP in the last 8760 hours. HbA1C: No results for input(s): HGBA1C in the last 72 hours. CBG: No results for input(s): GLUCAP in the last 168 hours. Lipid Profile: No results for input(s): CHOL, HDL, LDLCALC, TRIG, CHOLHDL, LDLDIRECT in the last 72 hours. Thyroid Function Tests: No results for input(s): TSH, T4TOTAL, FREET4, T3FREE, THYROIDAB in the last 72 hours. Anemia Panel: No results for input(s): VITAMINB12, FOLATE, FERRITIN, TIBC, IRON, RETICCTPCT in the last 72 hours. Urine analysis:    Component Value Date/Time   COLORURINE YELLOW (A) 12/18/2018 1026   APPEARANCEUR CLEAR (A) 12/18/2018 1026   APPEARANCEUR Clear 08/30/2013 0040   LABSPEC 1.016 12/18/2018 1026   LABSPEC 1.018 08/30/2013 0040   PHURINE 7.0 12/18/2018 1026    GLUCOSEU NEGATIVE 12/18/2018 1026   GLUCOSEU Negative 08/30/2013 0040   HGBUR NEGATIVE 12/18/2018 1026   BILIRUBINUR NEGATIVE 12/18/2018 1026   BILIRUBINUR Negative 08/30/2013 0040   KETONESUR NEGATIVE 12/18/2018 1026   PROTEINUR 30 (A) 12/18/2018 1026   NITRITE NEGATIVE 12/18/2018 1026   LEUKOCYTESUR NEGATIVE 12/18/2018 1026   LEUKOCYTESUR Negative 08/30/2013 0040   Sepsis Labs: @LABRCNTIP (procalcitonin:4,lacticidven:4) )No results found for this or any previous visit (from the past 240 hour(s)).   Radiological Exams on Admission: US Abdomen Limited RUQ (LIVER/GB)  Result Date: 06/14/2020 CLINICAL DATA:  Epigastric pain EXAM: ULTRASOUND ABDOMEN LIMITED RIGHT UPPER QUADRANT COMPARISON:  None. FINDINGS: Gallbladder: Gallbladder is well distended with multiple gallstones. No wall thickening or pericholecystic fluid is noted. Negative sonographic Murphy's sign is elicited. Common bile duct: Diameter: 8.8 mm Liver: Echogenic foci are noted within the liver consistent with hemangiomas. Portal vein is patent on color Doppler imaging with normal direction of blood flow towards the liver. Note is made of intrahepatic ductal dilatation. Other: 1.5 cm cystic lesion is noted in the head of the pancreas which appears simple in nature. IMPRESSION: Cholelithiasis without evidence of acute cholecystitis. Dilated common bile duct and intrahepatic ductal system suspicious for distal CBD stone. Multiple hyperechoic lesions in the liver consistent with hemangiomas. Cystic area in the head of the pancreas. Outpatient MRI is recommended for further evaluation to allow for optimum imaging. Electronically Signed   By: Inez Catalina M.D.   On: 06/14/2020 23:28      Assessment/Plan Principal Problem:   Choledocholithiasis Active Problems:   Seizure (HCC)   Schizophrenia (HCC)   Developmental disability   Leukocytosis   Diabetes ([place])   Essential hypertension     #1 choledocholithiasis: Patient will be  admitted.  Pain management.  Keep n.p.o. after midnight.  IV fluids.  Control nausea with vomiting.  GI to see and possible EGD with ERCP in the morning.  #2 seizure disorder: Patient on oral Keppra from home.  May likely give IV Keppra to prevent against withdrawals seizures.  Patient is n.p.o. after midnight.  #3 schizophrenia: Medicines on hold tonight hopefully resume in the morning.  #4diabetes: Diet controlled.  Blood sugar appears elevated.  Initiate sliding scale insulin with sensitive scale.  #  5 essential hypertension: Not on any medications at home.  As needed beta-blockers  #6 developmental disability: Patient fully functional.  Able to communicate well.   DVT prophylaxis: SCD Code Status: Full code Family Communication: No family at bedside Disposition Plan: Hopefully home Consults called: Dr. Thayer Jew, GI Admission status: Inpatient  Severity of Illness: The appropriate patient status for this patient is INPATIENT. Inpatient status is judged to be reasonable and necessary in order to provide the required intensity of service to ensure the patient's safety. The patient's presenting symptoms, physical exam findings, and initial radiographic and laboratory data in the context of their chronic comorbidities is felt to place them at high risk for further clinical deterioration. Furthermore, it is not anticipated that the patient will be medically stable for discharge from the hospital within 2 midnights of admission. The following factors support the patient status of inpatient.   " The patient's presenting symptoms include abdominal pain with nausea vomiting. " The worrisome physical exam findings include abdominal tenderness. " The initial radiographic and laboratory data are worrisome because of abnormal ultrasound as well as liver function tests. " The chronic co-morbidities include seizure disorder.   * I certify that at the point of admission it is my clinical judgment that  the patient will require inpatient hospital care spanning beyond 2 midnights from the point of admission due to high intensity of service, high risk for further deterioration and high frequency of surveillance required.Barbette Merino MD [facility] Pager [phone]  If 7PM-7AM, please contact night-coverage [email or web address] Password [facility]  06/15/2020, 12:22 AM

## 2020-06-15 NOTE — Op Note (Signed)
[facility] Gastroenterology Patient Name: Tina Holden Procedure Date: 06/15/2020 11:34 AM MRN: [id number] Account #: [id number] Date of Birth: 1950/09/30 Admit Type: Inpatient Age: 70 Room: [facility] ENDO ROOM 4 Gender: Female Note Status: Finalized Procedure:             ERCP Indications:           Common bile duct stone(s) Providers:             Lucilla Lame MD, MD Referring MD:          Caprice Renshaw MD (Referring MD) Medicines:             General Anesthesia Complications:         No immediate complications. Procedure:             Pre-Anesthesia Assessment:                        - Prior to the procedure, a History and Physical was                         performed, and patient medications and allergies were                         reviewed. The patient's tolerance of previous                         anesthesia was also reviewed. The risks and benefits                         of the procedure and the sedation options and risks                         were discussed with the patient. All questions were                         answered, and informed consent was obtained. Prior                         Anticoagulants: The patient has taken no previous                         anticoagulant or antiplatelet agents. ASA Grade                         Assessment: II - A patient with mild systemic disease.                         After reviewing the risks and benefits, the patient                         was deemed in satisfactory condition to undergo the                         procedure.                        After obtaining informed consent, the scope was passed  under direct vision. Throughout the procedure, the                         patient's blood pressure, pulse, and oxygen                         saturations were monitored continuously. The was                         introduced through the mouth, and used to inject                          contrast into and used to inject contrast into the                         dorsal pancreatic duct. The ERCP was accomplished                         without difficulty. The patient tolerated the                         procedure well. Findings:      The scout film was normal. The esophagus was successfully intubated       under direct vision. The scope was advanced to a normal major papilla in       the descending duodenum without detailed examination of the pharynx,       larynx and associated structures, and upper GI tract. The upper GI tract       was grossly normal. The bile duct was deeply cannulated with the       short-nosed traction sphincterotome. Contrast was injected. I personally       interpreted the bile duct images. There was brisk flow of contrast       through the ducts. Image quality was excellent. Contrast extended to the       entire biliary tree. The lower third of the main bile duct contained       filling defect(s). A wire was passed into the biliary tree. A 6 mm       biliary sphincterotomy was made with a traction (standard)       sphincterotome using ERBE electrocautery. There was no       post-sphincterotomy bleeding. The biliary tree was swept with a 15 mm       balloon starting at the bifurcation. One stone was removed. No stones       remained. Impression:            - A filling defect was seen on the cholangiogram.                        - Choledocholithiasis was found. Complete removal was                         accomplished by biliary sphincterotomy and balloon                         extraction.                        - A biliary sphincterotomy was performed.                        -  The biliary tree was swept. Recommendation:        - Clear liquid diet.                        - Watch for pancreatitis, bleeding, perforation, and                         cholangitis.                        - Return patient to hospital ward for ongoing care.                         - Continue present medications. Procedure Code(s):     --- Professional ---                        [phone], Endoscopic retrograde cholangiopancreatography                         (ERCP); with removal of calculi/debris from                         biliary/pancreatic duct(s)                        [id number], Endoscopic retrograde cholangiopancreatography                         (ERCP); with sphincterotomy/papillotomy                        [phone], Endoscopic catheterization of the biliary                         ductal system, radiological supervision and                         interpretation Diagnosis Code(s):     --- Professional ---                        K80.50, Calculus of bile duct without cholangitis or                         cholecystitis without obstruction                        R93.2, Abnormal findings on diagnostic imaging of                         liver and biliary tract CPT copyright 2019 American Medical Association. All rights reserved. The codes documented in this report are preliminary and upon coder review may  be revised to meet current compliance requirements. Lucilla Lame MD, MD 06/15/2020 12:55:10 PM This report has been signed electronically. Number of Addenda: 0 Note Initiated On: 06/15/2020 11:34 AM Estimated Blood Loss:  Estimated blood loss: none.      [facility]

## 2020-06-16 ENCOUNTER — Encounter
Admission: EM | Disposition: A | Discharge status: Home Health | Payer: Medicare Other | Source: Skilled Nursing Facility | Attending: Internal Medicine

## 2020-06-16 ENCOUNTER — Inpatient Hospital Stay: Payer: Medicare Other | Admitting: Anesthesiology

## 2020-06-16 ENCOUNTER — Encounter: Payer: Self-pay | Admitting: Internal Medicine

## 2020-06-16 DIAGNOSIS — K8012 Calculus of gallbladder with acute and chronic cholecystitis without obstruction: Secondary | ICD-10-CM | POA: Diagnosis not present

## 2020-06-16 DIAGNOSIS — K8050 Calculus of bile duct without cholangitis or cholecystitis without obstruction: Secondary | ICD-10-CM | POA: Diagnosis not present

## 2020-06-16 DIAGNOSIS — D131 Benign neoplasm of stomach: Secondary | ICD-10-CM

## 2020-06-16 LAB — GLUCOSE, CAPILLARY
Glucose-Capillary: 123 mg/dL — ABNORMAL HIGH (ref 70–99)
Glucose-Capillary: 103 mg/dL — ABNORMAL HIGH (ref 70–99)
Glucose-Capillary: 126 mg/dL — ABNORMAL HIGH (ref 70–99)

## 2020-06-16 SURGERY — CHOLECYSTECTOMY, ROBOT-ASSISTED, LAPAROSCOPIC
Anesthesia: General | Site: Abdomen

## 2020-06-16 MED ORDER — SUCCINYLCHOLINE CHLORIDE 20 MG/ML IJ SOLN
INTRAMUSCULAR | Status: DC | PRN
  Administered 2020-06-16: 140 mg via INTRAVENOUS

## 2020-06-16 MED ORDER — FAMOTIDINE 20 MG PO TABS
20.0000 mg | ORAL_TABLET | Freq: Every day | ORAL | Status: DC
  Administered 2020-06-16 – 2020-06-17 (×2): 20 mg via ORAL
  Filled 2020-06-16 (×2): qty 1

## 2020-06-16 MED ORDER — FENTANYL CITRATE (PF) 100 MCG/2ML IJ SOLN: 25.0000 ug | INTRAMUSCULAR | Status: DC | PRN

## 2020-06-16 MED ORDER — PROPOFOL 10 MG/ML IV BOLUS
INTRAVENOUS | Status: AC
  Filled 2020-06-16: qty 20

## 2020-06-16 MED ORDER — FENTANYL CITRATE (PF) 100 MCG/2ML IJ SOLN
INTRAMUSCULAR | Status: AC
  Filled 2020-06-16: qty 2

## 2020-06-16 MED ORDER — ACETAMINOPHEN 500 MG PO TABS
1000.0000 mg | ORAL_TABLET | Freq: Four times a day (QID) | ORAL | Status: DC
  Administered 2020-06-16 – 2020-06-17 (×2): 1000 mg via ORAL
  Filled 2020-06-16 (×3): qty 2

## 2020-06-16 MED ORDER — DONEPEZIL HCL 5 MG PO TABS
10.0000 mg | ORAL_TABLET | Freq: Every day | ORAL | Status: DC
Start: 2020-06-16 — End: 2020-06-17
  Administered 2020-06-16 – 2020-06-17 (×2): 10 mg via ORAL
  Filled 2020-06-16 (×3): qty 2

## 2020-06-16 MED ORDER — LAMOTRIGINE 25 MG PO TABS
150.0000 mg | ORAL_TABLET | Freq: Two times a day (BID) | ORAL | Status: DC
  Administered 2020-06-16 – 2020-06-17 (×2): 150 mg via ORAL
  Filled 2020-06-16 (×2): qty 1

## 2020-06-16 MED ORDER — GABAPENTIN 300 MG PO CAPS
300.0000 mg | ORAL_CAPSULE | Freq: Two times a day (BID) | ORAL | Status: DC
  Administered 2020-06-16 – 2020-06-17 (×2): 300 mg via ORAL
  Filled 2020-06-16 (×2): qty 1

## 2020-06-16 MED ORDER — BUPIVACAINE-EPINEPHRINE (PF) 0.25% -1:200000 IJ SOLN
INTRAMUSCULAR | Status: DC | PRN
  Administered 2020-06-16: 30 mL via PERINEURAL

## 2020-06-16 MED ORDER — ONDANSETRON HCL 4 MG/2ML IJ SOLN
INTRAMUSCULAR | Status: DC | PRN
  Administered 2020-06-16: 4 mg via INTRAVENOUS

## 2020-06-16 MED ORDER — FLUTICASONE PROPIONATE 50 MCG/ACT NA SUSP
2.0000 | Freq: Every day | NASAL | Status: DC
  Administered 2020-06-16 – 2020-06-17 (×2): 2 via NASAL
  Filled 2020-06-16 (×2): qty 16

## 2020-06-16 MED ORDER — QUETIAPINE FUMARATE ER 50 MG PO TB24
100.0000 mg | ORAL_TABLET | Freq: Every day | ORAL | Status: DC
  Administered 2020-06-16: 100 mg via ORAL
  Filled 2020-06-16 (×2): qty 2

## 2020-06-16 MED ORDER — ROCURONIUM BROMIDE 100 MG/10ML IV SOLN
INTRAVENOUS | Status: DC | PRN
  Administered 2020-06-16: 50 mg via INTRAVENOUS

## 2020-06-16 MED ORDER — GLYCOPYRROLATE 0.2 MG/ML IJ SOLN
INTRAMUSCULAR | Status: DC | PRN
  Administered 2020-06-16: .2 mg via INTRAVENOUS

## 2020-06-16 MED ORDER — TRAZODONE HCL 100 MG PO TABS: 100.0000 mg | ORAL_TABLET | Freq: Every evening | ORAL | Status: DC | PRN

## 2020-06-16 MED ORDER — SUGAMMADEX SODIUM 500 MG/5ML IV SOLN
INTRAVENOUS | Status: DC | PRN
  Administered 2020-06-16: 200 mg via INTRAVENOUS

## 2020-06-16 MED ORDER — FENTANYL CITRATE (PF) 100 MCG/2ML IJ SOLN
INTRAMUSCULAR | Status: DC | PRN
  Administered 2020-06-16 (×2): 50 ug via INTRAVENOUS

## 2020-06-16 MED ORDER — OXYCODONE HCL 5 MG PO TABS: 5.0000 mg | ORAL_TABLET | ORAL | Status: DC | PRN

## 2020-06-16 MED ORDER — INDOCYANINE GREEN 25 MG IV SOLR
2.5000 mg | Freq: Once | INTRAVENOUS | Status: DC
  Filled 2020-06-16: qty 10

## 2020-06-16 MED ORDER — LIDOCAINE HCL (CARDIAC) PF 100 MG/5ML IV SOSY
PREFILLED_SYRINGE | INTRAVENOUS | Status: DC | PRN
  Administered 2020-06-16: 100 mg via INTRAVENOUS

## 2020-06-16 MED ORDER — POLYETHYLENE GLYCOL 3350 17 G PO PACK: 17.0000 g | PACK | Freq: Every day | ORAL | Status: DC | PRN

## 2020-06-16 MED ORDER — PROPOFOL 10 MG/ML IV BOLUS
INTRAVENOUS | Status: DC | PRN
  Administered 2020-06-16: 130 mg via INTRAVENOUS

## 2020-06-16 SURGICAL SUPPLY — 48 items
CANISTER SUCT 1200ML W/VALVE (MISCELLANEOUS) ×2 IMPLANT
CANNULA REDUC XI 12-8 STAPL (CANNULA) ×1
CANNULA REDUCER 12-8 DVNC XI (CANNULA) ×1 IMPLANT
CHLORAPREP W/TINT 26 (MISCELLANEOUS) ×2 IMPLANT
CLIP VESOLOCK MED LG 6/CT (CLIP) ×2 IMPLANT
COVER WAND RF STERILE (DRAPES) ×2 IMPLANT
DECANTER SPIKE VIAL GLASS SM (MISCELLANEOUS) ×2 IMPLANT
DEFOGGER SCOPE WARMER CLEARIFY (MISCELLANEOUS) ×4 IMPLANT
DERMABOND ADVANCED (GAUZE/BANDAGES/DRESSINGS) ×1
DERMABOND ADVANCED .7 DNX12 (GAUZE/BANDAGES/DRESSINGS) ×1 IMPLANT
DRAPE ARM DVNC X/XI (DISPOSABLE) ×4 IMPLANT
DRAPE COLUMN DVNC XI (DISPOSABLE) ×1 IMPLANT
DRAPE DA VINCI XI ARM (DISPOSABLE) ×4
DRAPE DA VINCI XI COLUMN (DISPOSABLE) ×1
ELECT CAUTERY BLADE 6.4 (BLADE) ×2 IMPLANT
ELECT REM PT RETURN 9FT ADLT (ELECTROSURGICAL) ×2
ELECTRODE REM PT RTRN 9FT ADLT (ELECTROSURGICAL) ×1 IMPLANT
GLOVE SURG ENC MOIS LTX SZ7 (GLOVE) ×4 IMPLANT
GOWN STRL REUS W/ TWL LRG LVL3 (GOWN DISPOSABLE) ×4 IMPLANT
GOWN STRL REUS W/TWL LRG LVL3 (GOWN DISPOSABLE) ×4
IRRIGATION STRYKERFLOW (MISCELLANEOUS) IMPLANT
IRRIGATOR STRYKERFLOW (MISCELLANEOUS)
IV NS 1000ML (IV SOLUTION)
IV NS 1000ML BAXH (IV SOLUTION) IMPLANT
KIT PINK PAD W/HEAD ARE REST (MISCELLANEOUS) ×2
KIT PINK PAD W/HEAD ARM REST (MISCELLANEOUS) ×1 IMPLANT
LABEL OR SOLS (LABEL) ×2 IMPLANT
MANIFOLD NEPTUNE II (INSTRUMENTS) ×2 IMPLANT
NEEDLE HYPO 22GX1.5 SAFETY (NEEDLE) ×2 IMPLANT
NS IRRIG 500ML POUR BTL (IV SOLUTION) ×2 IMPLANT
OBTURATOR OPTICAL STANDARD 8MM (TROCAR) ×1
OBTURATOR OPTICAL STND 8 DVNC (TROCAR) ×1
OBTURATOR OPTICALSTD 8 DVNC (TROCAR) ×1 IMPLANT
PACK LAP CHOLECYSTECTOMY (MISCELLANEOUS) ×2 IMPLANT
PENCIL ELECTRO HAND CTR (MISCELLANEOUS) ×2 IMPLANT
POUCH SPECIMEN RETRIEVAL 10MM (ENDOMECHANICALS) ×2 IMPLANT
SEAL CANN UNIV 5-8 DVNC XI (MISCELLANEOUS) ×3 IMPLANT
SEAL XI 5MM-8MM UNIVERSAL (MISCELLANEOUS) ×3
SET TUBE SMOKE EVAC HIGH FLOW (TUBING) ×2 IMPLANT
SOLUTION ELECTROLUBE (MISCELLANEOUS) ×2 IMPLANT
SPONGE LAP 18X18 RF (DISPOSABLE) ×4 IMPLANT
SPONGE LAP 4X18 RFD (DISPOSABLE) ×2 IMPLANT
STAPLER CANNULA SEAL DVNC XI (STAPLE) ×1 IMPLANT
STAPLER CANNULA SEAL XI (STAPLE) ×1
SUT MNCRL AB 4-0 PS2 18 (SUTURE) ×2 IMPLANT
SUT VICRYL 0 AB UR-6 (SUTURE) ×6 IMPLANT
TAPE TRANSPORE STRL 2 31045 (GAUZE/BANDAGES/DRESSINGS) ×2 IMPLANT
TROCAR BALLN GELPORT 12X130M (ENDOMECHANICALS) ×2 IMPLANT

## 2020-06-16 NOTE — Progress Notes (Signed)
PROGRESS NOTE    Tina Holden   TSV:[id number]  DOB: 13-Apr-1950  PCP: Derinda Late, MD    DOA: 06/14/2020 LOS: 1   Brief Narrative   Tina Holden is a 70 y.o. female with medical history significant of diabetes, schizophrenia, seizure disorder, diabetes not on any medications, osteoarthritis and developmental disability who presented to the ED on evening of 06/14/20 with 1 day of sudden onset of right upper quadrant abdominal pain with some nausea and episode of vomiting.  She was found to have choledocholithiasis.    Admitted to hospitalist service with GI consulted. Underwent ERCP on 3/26 with complete stone removal was successful.  General surgery was consulted and took pt for robotic-assisted lap chole on 3/27.    On IV antibiotics.  Monitoring for signs of pancreatitis, cholangitis or other complications.  Assessment & Plan   Principal Problem:   Choledocholithiasis Active Problems:   Seizure ([place])   Schizophrenia (H[facility])   Developmental disability   Leukocytosis   Diabetes ([place])   Essential hypertension   Choledocholithiasis - s/p ERCP and cholecystectomy.  GI has signed off. General surgery following. Monitor for pancreatitis, cholangitis, or other complications. Continue IV antibiotics, on Merrem. Continue IV fluids. Diet advancement per surgery. Pain control. Bowel regimen.  Seizure disorder - takes Neurontin and Lamictal - resumed. Seizure precautions.  Schizophrenia - resume home Seroquel, trazodone, donepezil  Developmental disability - communicates fairly well, functional.  Lives in group home.  Will get PT evaluation to ensure she's independent and able to return there at d/c.  Type 2 Diabetes - diet controlled. Sliding scale Novolog. Mild hyperglycemia but within inpatient goal.  Essential hypertension - appears not on medication.  Monitor. PRN ordered.   Pancreatic cyst/mass - seen on CT, see report.   Cystic area in the head  of the pancreas.  Outpatient MRI is recommended for further evaluation to allow for optimum imaging.  ?Hemagiomas of liver - Seen on CT, see report.   Multiple hyperechoic lesions in the liver consistent with Hemangiomas.  Monitor.   PT evaluation pending to ensure patient remains fully independent for return to her group home.    Patient BMI: Body mass index is 26.74 kg/m.   DVT prophylaxis: SCDs Start: 06/15/20 0235   Diet:  Diet Orders (From admission, onward)    Start     Ordered   06/16/20 1152  Diet full liquid Room service appropriate? Yes; Fluid consistency: Thin  Diet effective now       Question Answer Comment  Room service appropriate? Yes   Fluid consistency: Thin      06/16/20 1151            Code Status: Full Code    Subjective 06/16/20    Pt seen after chole today.  Reports some mild pain, no nausea or vomiting.  Otherwise feels okay with no other acute complaints.   Disposition Plan & Communication   Status is: Inpatient  Inpatient status appropriate due to severity of illness and on IV therapies as above.   Dispo: The patient is from: group home              Anticipated d/c is to: group home              Patient currently NOT medically stable for dc.   Difficult to place patient No   Consults, Procedures, Significant Events   Consultants:   GI  General surgery  Procedures:   ERCP  Lap  Chole  Antimicrobials:  Anti-infectives (From admission, onward)   Start     Dose/Rate Route Frequency Ordered Stop   06/15/20 0600  meropenem (MERREM) 500 mg in sodium chloride 0.9 % 100 mL IVPB  Status:  Discontinued        500 mg 200 mL/hr over 30 Minutes Intravenous Every 8 hours 06/15/20 0234 06/15/20 0313   06/15/20 0600  meropenem (MERREM) 1 g in sodium chloride 0.9 % 100 mL IVPB        1 g 200 mL/hr over 30 Minutes Intravenous Every 8 hours 06/15/20 0313     06/14/20 2345  piperacillin-tazobactam (ZOSYN) IVPB 3.375 g        3.375 g 100  mL/hr over 30 Minutes Intravenous  Once 06/14/20 2336 06/15/20 0033        Micro    Objective   Vitals:   06/16/20 1100 06/16/20 1115 06/16/20 1130 06/16/20 1158  BP: (!) 148/70 (!) 157/74 (!) 153/71 (!) 155/71  Pulse: 66 60 66 (!) 55  Resp: 20 15 19 16   Temp:   98.9 F (37.2 C) 98.8 F (37.1 C)  TempSrc:    Oral  SpO2: 95% 95% 95% 97%  Weight:      Height:        Intake/Output Summary (Last 24 hours) at 06/16/2020 1514 Last data filed at 06/16/2020 1410 Gross per 24 hour  Intake 2360 ml  Output -  Net 2360 ml   Filed Weights   06/14/20 2010  Weight: 77.4 kg    Physical Exam:  General exam: awake, alert, no acute distress HEENT: anicteric sclera, moist mucus membranes, hearing grossly normal  Respiratory system: CTAB, no wheezes, rales or rhonchi, normal respiratory effort. Cardiovascular system: normal S1/S2, RRR, no JVD, murmurs, rubs, gallops, no pedal edema.   Gastrointestinal system: soft, post-op tenderness Central nervous system: no gross focal neurologic deficits, normal speech Skin: dry, intact, normal temperature Psychiatry: normal mood, congruent affect  Labs   Data Reviewed: I have personally reviewed following labs and imaging studies  CBC: Recent Labs  Lab 06/14/20 2020 06/15/20 0513  WBC 13.0* 16.6*  NEUTROABS 11.9*  --   HGB 15.0 14.1  HCT 46.3* 42.8  MCV 92.4 92.0  PLT 194 366   Basic Metabolic Panel: Recent Labs  Lab 06/14/20 2020 06/15/20 0513 06/15/20 1036  NA 137 137 136  K 4.0 4.5 4.2  CL 101 103 105  CO2 24 24 21*  GLUCOSE 165* 124* 118*  BUN 16 14 13   CREATININE 0.80 0.73 0.73  CALCIUM 9.4 9.3 8.8*   GFR: Estimated Creatinine Clearance: 69.1 mL/min (by C-G formula based on SCr of 0.73 mg/dL). Liver Function Tests: Recent Labs  Lab 06/14/20 2020 06/15/20 0513 06/15/20 1036  AST 203* 284* 277*  ALT 132* 252* 284*  ALKPHOS 93 91 93  BILITOT 2.9* 4.2* 5.7*  PROT 7.4 6.6 6.4*  ALBUMIN 4.7 4.0 3.7   Recent  Labs  Lab 06/14/20 2020  LIPASE 55*   No results for input(s): AMMONIA in the last 168 hours. Coagulation Profile: No results for input(s): INR, PROTIME in the last 168 hours. Cardiac Enzymes: No results for input(s): CKTOTAL, CKMB, CKMBINDEX, TROPONINI in the last 168 hours. BNP (last 3 results) No results for input(s): PROBNP in the last 8760 hours. HbA1C: Recent Labs    06/15/20 0513  HGBA1C 5.2   CBG: Recent Labs  Lab 06/15/20 0733 06/15/20 0939 06/15/20 1654 06/15/20 2110 06/16/20 1049  GLUCAP 132* 111*  116* 141* 123*   Lipid Profile: No results for input(s): CHOL, HDL, LDLCALC, TRIG, CHOLHDL, LDLDIRECT in the last 72 hours. Thyroid Function Tests: No results for input(s): TSH, T4TOTAL, FREET4, T3FREE, THYROIDAB in the last 72 hours. Anemia Panel: No results for input(s): VITAMINB12, FOLATE, FERRITIN, TIBC, IRON, RETICCTPCT in the last 72 hours. Sepsis Labs: No results for input(s): PROCALCITON, LATICACIDVEN in the last 168 hours.  Recent Results (from the past 240 hour(s))  Resp Panel by RT-PCR (Flu A&B, Covid) Nasopharyngeal Swab     Status: None   Collection Time: 06/15/20 12:06 AM   Specimen: Nasopharyngeal Swab; Nasopharyngeal(NP) swabs in vial transport medium  Result Value Ref Range Status   SARS Coronavirus 2 by RT PCR NEGATIVE NEGATIVE Final    Comment: (NOTE) SARS-CoV-2 target nucleic acids are NOT DETECTED.  The SARS-CoV-2 RNA is generally detectable in upper respiratory specimens during the acute phase of infection. The lowest concentration of SARS-CoV-2 viral copies this assay can detect is 138 copies/mL. A negative result does not preclude SARS-Cov-2 infection and should not be used as the sole basis for treatment or other patient management decisions. A negative result may occur with  improper specimen collection/handling, submission of specimen other than nasopharyngeal swab, presence of viral mutation(s) within the areas targeted by this  assay, and inadequate number of viral copies(<138 copies/mL). A negative result must be combined with clinical observations, patient history, and epidemiological information. The expected result is Negative.  Fact Sheet for Patients:  EntrepreneurPulse.com.au  Fact Sheet for Healthcare Providers:  IncredibleEmployment.be  This test is no t yet approved or cleared by the [place] FDA and  has been authorized for detection and/or diagnosis of SARS-CoV-2 by FDA under an Emergency Use Authorization (EUA). This EUA will remain  in effect (meaning this test can be used) for the duration of the COVID-19 declaration under Section 564(b)(1) of the Act, 21 U.S.C.section 360bbb-3(b)(1), unless the authorization is terminated  or revoked sooner.       Influenza A by PCR NEGATIVE NEGATIVE Final   Influenza B by PCR NEGATIVE NEGATIVE Final    Comment: (NOTE) The Xpert Xpress SARS-CoV-2/FLU/RSV plus assay is intended as an aid in the diagnosis of influenza from Nasopharyngeal swab specimens and should not be used as a sole basis for treatment. Nasal washings and aspirates are unacceptable for Xpert Xpress SARS-CoV-2/FLU/RSV testing.  Fact Sheet for Patients: EntrepreneurPulse.com.au  Fact Sheet for Healthcare Providers: IncredibleEmployment.be  This test is not yet approved or cleared by the [place] FDA and has been authorized for detection and/or diagnosis of SARS-CoV-2 by FDA under an Emergency Use Authorization (EUA). This EUA will remain in effect (meaning this test can be used) for the duration of the COVID-19 declaration under Section 564(b)(1) of the Act, 21 U.S.C. section 360bbb-3(b)(1), unless the authorization is terminated or revoked.  Performed at [facility], [address] [place], [place] [address]       Imaging Studies   DG C-Arm 1-60 Min-No Report  Result Date:  06/15/2020 Fluoroscopy was utilized by the requesting physician.  No radiographic interpretation.   US Abdomen Limited RUQ (LIVER/GB)  Result Date: 06/14/2020 CLINICAL DATA:  Epigastric pain EXAM: ULTRASOUND ABDOMEN LIMITED RIGHT UPPER QUADRANT COMPARISON:  None. FINDINGS: Gallbladder: Gallbladder is well distended with multiple gallstones. No wall thickening or pericholecystic fluid is noted. Negative sonographic Murphy's sign is elicited. Common bile duct: Diameter: 8.8 mm Liver: Echogenic foci are noted within the liver consistent with hemangiomas. Portal vein is  patent on color Doppler imaging with normal direction of blood flow towards the liver. Note is made of intrahepatic ductal dilatation. Other: 1.5 cm cystic lesion is noted in the head of the pancreas which appears simple in nature. IMPRESSION: Cholelithiasis without evidence of acute cholecystitis. Dilated common bile duct and intrahepatic ductal system suspicious for distal CBD stone. Multiple hyperechoic lesions in the liver consistent with hemangiomas. Cystic area in the head of the pancreas. Outpatient MRI is recommended for further evaluation to allow for optimum imaging. Electronically Signed   By: Inez Catalina M.D.   On: 06/14/2020 23:28     Medications   Scheduled Meds: . acetaminophen  1,000 mg Oral Q6H  . insulin aspart  0-5 Units Subcutaneous QHS  . insulin aspart  0-9 Units Subcutaneous TID WC   Continuous Infusions: . lactated ringers 100 mL/hr at 06/16/20 1200  . meropenem (MERREM) IV 1 g (06/16/20 1511)       LOS: 1 day    Time spent: 30 minutes    Ezekiel Slocumb, DO [facility]  06/16/2020, 3:14 PM      If 7PM-7AM, please contact night-coverage. How to contact the [facility] Attending or Consulting provider [place] or covering provider during after hours [place], for this patient?    1. Check the care team in [facility] and look for a) attending/consulting [facility] provider listed and b) the [facility] team  listed 2. Log into [email or web address] and use [place]'s universal password to access. If you do not have the password, please contact the hospital operator. 3. Locate the [facility] provider you are looking for under [facility] and page to a number that you can be directly reached. 4. If you still have difficulty reaching the provider, please page the [facility] (Director on Call) for the Hospitalists listed on amion for assistance.

## 2020-06-16 NOTE — Anesthesia Procedure Notes (Signed)
Procedure Name: Intubation Date/Time: 06/16/2020 9:13 AM Performed by: Lesle Reek, CRNA Pre-anesthesia Checklist: Patient identified, Emergency Drugs available, Suction available, Patient being monitored and Timeout performed Patient Re-evaluated:Patient Re-evaluated prior to induction Oxygen Delivery Method: Circle system utilized Preoxygenation: Pre-oxygenation with 100% oxygen Induction Type: IV induction Ventilation: Mask ventilation without difficulty Laryngoscope Size: Mac and 3 Grade View: Grade I Tube type: Oral Tube size: 7.0 mm Number of attempts: 1 Airway Equipment and Method: Stylet Placement Confirmation: ETT inserted through vocal cords under direct vision,  positive ETCO2 and breath sounds checked- equal and bilateral Secured at: 21 cm Tube secured with: Tape Dental Injury: Teeth and Oropharynx as per pre-operative assessment

## 2020-06-16 NOTE — Progress Notes (Signed)
The patient underwent an ERCP yesterday with a sphincterotomy and stone extraction.  The patient was reported by the nursing staff to have done well last night.  The patient is scheduled for a cholecystectomy today.  Nothing further to do from a GI point of view.  I will sign off.  Please call if any further GI concerns or questions.  We would like to thank you for the opportunity to participate in the care of Tina Holden.

## 2020-06-16 NOTE — Op Note (Signed)
Robotic assisted laparoscopic Cholecystectomy  Pre-operative Diagnosis: Choledocholithiasis with acute cholecystitis  Post-operative Diagnosis: Same  Procedure:  Robotic assisted laparoscopic Cholecystectomy  Surgeon: Caroleen Hamman, MD FACS  Anesthesia: Gen. with endotracheal tube  Findings: Acute cholecystitis multiple large stones  Estimated Blood Loss: 10cc       Specimens: Gallbladder           Complications: none   Procedure Details  The patient was seen again in the Holding Room. The benefits, complications, treatment options, and expected outcomes were discussed with the patient. The risks of bleeding, infection, recurrence of symptoms, failure to resolve symptoms, bile duct damage, bile duct leak, retained common bile duct stone, bowel injury, any of which could require further surgery and/or ERCP, stent, or papillotomy were reviewed with the patient. The likelihood of improving the patient's symptoms with return to their baseline status is good.  The patient and/or family concurred with the proposed plan, giving informed consent.  The patient was taken to Operating Room, identified  and the procedure verified as Laparoscopic Cholecystectomy.  A Time Out was held and the above information confirmed.  Prior to the induction of general anesthesia, antibiotic prophylaxis was administered. VTE prophylaxis was in place. General endotracheal anesthesia was then administered and tolerated well. After the induction, the abdomen was prepped with Chloraprep and draped in the sterile fashion. The patient was positioned in the supine position.  Cut down technique was used to enter the abdominal cavity and a Hasson trochar was placed after two vicryl stitches were anchored to the fascia. Pneumoperitoneum was then created with CO2 and tolerated well without any adverse changes in the patient's vital signs.  Three 8-mm ports were placed under direct vision. All skin incisions  were infiltrated  with a local anesthetic agent before making the incision and placing the trocars.   The patient was positioned  in reverse Trendelenburg, robot was brought to the surgical field and docked in the standard fashion.  We made sure all the instrumentation was kept indirect view at all times and that there were no collision between the arms. I scrubbed out and went to the console.  The gallbladder was identified, the fundus grasped and retracted cephalad.  Gallbladder was found to be extremely large and distended with multiple gallstones.  Adhesions were lysed bluntly. The infundibulum was grasped and retracted laterally, exposing the peritoneum overlying the triangle of Calot. This was then divided and exposed in a blunt fashion. An extended critical view of the cystic duct and cystic artery was obtained.  The cystic duct was clearly identified and bluntly dissected.   Artery and duct were double clipped and divided. Using ICG cholangiography we visualize the cystic duct and so no a Baron biliary ductal anatomy or evidence of bile injuries. The gallbladder was taken from the gallbladder fossa in a retrograde fashion with the electrocautery.  Hemostasis was achieved with the electrocautery. nspection of the right upper quadrant was performed. No bleeding, bile duct injury or leak, or bowel injury was noted. Robotic instruments and robotic arms were undocked in the standard fashion.  I scrubbed back in.  The gallbladder was removed and placed in an Endocatch bag.   Pneumoperitoneum was released.  The periumbilical port site was closed with interrumpted 0 Vicryl sutures. 4-0 subcuticular Monocryl was used to close the skin. Dermabond was  applied.  The patient was then extubated and brought to the recovery room in stable condition. Sponge, lap, and needle counts were correct at closure and at  the conclusion of the case.               Caroleen Hamman, MD, FACS

## 2020-06-16 NOTE — Anesthesia Preprocedure Evaluation (Signed)
Anesthesia Evaluation  Patient identified by MRN, date of birth, ID band Patient awake    Reviewed: Allergy & Precautions, H&P , NPO status , Patient's Chart, lab work & pertinent test results, reviewed documented beta blocker date and time   History of Anesthesia Complications Negative for: history of anesthetic complications  Airway Mallampati: II  TM Distance: >3 FB Neck ROM: full    Dental  (+) Dental Advidsory Given, Poor Dentition, Chipped, Missing   Pulmonary neg pulmonary ROS, neg shortness of breath,    Pulmonary exam normal        Cardiovascular Exercise Tolerance: Good hypertension, Normal cardiovascular exam     Neuro/Psych Seizures -, Well Controlled,  PSYCHIATRIC DISORDERS Schizophrenia    GI/Hepatic negative GI ROS, Neg liver ROS,   Endo/Other  diabetes, Type 2  Renal/GU negative Renal ROS  negative genitourinary   Musculoskeletal  (+) Arthritis ,   Abdominal   Peds  Hematology negative hematology ROS (+)   Anesthesia Other Findings Past Medical History: No date: Arthritis No date: Diabetes mellitus without complication (HCC) No date: Schizophrenia ([place]) No date: Seizures ([place])   Reproductive/Obstetrics negative OB ROS                             Anesthesia Physical  Anesthesia Plan  ASA: III  Anesthesia Plan: General ETT   Post-op Pain Management:    Induction: Intravenous  PONV Risk Score and Plan: 3 and Ondansetron, Dexamethasone and Midazolam  Airway Management Planned: Oral ETT  Additional Equipment:   Intra-op Plan:   Post-operative Plan: Extubation in OR  Informed Consent: I have reviewed the patients History and Physical, chart, labs and discussed the procedure including the risks, benefits and alternatives for the proposed anesthesia with the patient or authorized representative who has indicated his/her understanding and acceptance.      Dental Advisory Given  Plan Discussed with: Anesthesiologist, CRNA and Surgeon  Anesthesia Plan Comments: (Phone consent from the patients brother Cathline Dowen at [phone]   Brother consented for risks of anesthesia including but not limited to:  - adverse reactions to medications - damage to eyes, teeth, lips or other oral mucosa - nerve damage due to positioning  - sore throat or hoarseness - Damage to heart, brain, nerves, lungs, other parts of body or loss of life  He voiced understanding.)        Anesthesia Quick Evaluation

## 2020-06-16 NOTE — Plan of Care (Signed)
Continuing with plan of care. 

## 2020-06-16 NOTE — Anesthesia Postprocedure Evaluation (Signed)
Anesthesia Post Note  Patient: Tina Holden  Procedure(s) Performed: XI ROBOTIC ASSISTED LAPAROSCOPIC CHOLECYSTECTOMY (N/A Abdomen)  Patient location during evaluation: PACU Anesthesia Type: General Level of consciousness: awake and alert Pain management: pain level controlled Vital Signs Assessment: post-procedure vital signs reviewed and stable Respiratory status: spontaneous breathing, nonlabored ventilation, respiratory function stable and patient connected to nasal cannula oxygen Cardiovascular status: blood pressure returned to baseline and stable Postop Assessment: no apparent nausea or vomiting Anesthetic complications: no   No complications documented.   Last Vitals:  Vitals:   06/16/20 1115 06/16/20 1130  BP: (!) 157/74 (!) 153/71  Pulse: 60 66  Resp: 15 19  Temp:  37.2 C  SpO2: 95% 95%    Last Pain:  Vitals:   06/16/20 1130  TempSrc:   PainSc: Asleep                 Precious Haws [place]it[place]

## 2020-06-16 NOTE — Transfer of Care (Signed)
Immediate Anesthesia Transfer of Care Note  Patient: Tina Holden  Procedure(s) Performed: XI ROBOTIC ASSISTED LAPAROSCOPIC CHOLECYSTECTOMY (N/A Abdomen)  Patient Location: PACU  Anesthesia Type:General  Level of Consciousness: awake, alert  and oriented  Airway & Oxygen Therapy: Patient Spontanous Breathing  Post-op Assessment: Report given to RN  Post vital signs: Reviewed and stable  Last Vitals:  Vitals Value Taken Time  BP    Temp    Pulse    Resp    SpO2      Last Pain:  Vitals:   06/16/20 0805  TempSrc:   PainSc: 0-No pain         Complications: No complications documented.

## 2020-06-17 DIAGNOSIS — K8050 Calculus of bile duct without cholangitis or cholecystitis without obstruction: Secondary | ICD-10-CM | POA: Diagnosis not present

## 2020-06-17 LAB — CBC
HCT: 35.3 % — ABNORMAL LOW (ref 36.0–46.0)
Hemoglobin: 12 g/dL (ref 12.0–15.0)
MCH: 30.8 pg (ref 26.0–34.0)
MCHC: 34 g/dL (ref 30.0–36.0)
MCV: 90.7 fL (ref 80.0–100.0)
Platelets: 189 10*3/uL (ref 150–400)
RBC: 3.89 MIL/uL (ref 3.87–5.11)
RDW: 13 % (ref 11.5–15.5)
WBC: 7.4 10*3/uL (ref 4.0–10.5)
nRBC: 0 % (ref 0.0–0.2)

## 2020-06-17 LAB — COMPREHENSIVE METABOLIC PANEL
ALT: 267 U/L — ABNORMAL HIGH (ref 0–44)
AST: 114 U/L — ABNORMAL HIGH (ref 15–41)
Albumin: 3 g/dL — ABNORMAL LOW (ref 3.5–5.0)
Alkaline Phosphatase: 109 U/L (ref 38–126)
Anion gap: 6 (ref 5–15)
BUN: 10 mg/dL (ref 8–23)
CO2: 27 mmol/L (ref 22–32)
Calcium: 8.2 mg/dL — ABNORMAL LOW (ref 8.9–10.3)
Chloride: 104 mmol/L (ref 98–111)
Creatinine, Ser: 0.67 mg/dL (ref 0.44–1.00)
GFR, Estimated: >60 mL/min (ref >60)
Glucose, Bld: 97 mg/dL (ref 70–99)
Potassium: 3.9 mmol/L (ref 3.5–5.1)
Sodium: 137 mmol/L (ref 135–145)
Total Bilirubin: 1.6 mg/dL — ABNORMAL HIGH (ref 0.3–1.2)
Total Protein: 5.2 g/dL — ABNORMAL LOW (ref 6.5–8.1)

## 2020-06-17 LAB — GLUCOSE, CAPILLARY
Glucose-Capillary: 116 mg/dL — ABNORMAL HIGH (ref 70–99)
Glucose-Capillary: 100 mg/dL — ABNORMAL HIGH (ref 70–99)
Glucose-Capillary: 100 mg/dL — ABNORMAL HIGH (ref 70–99)

## 2020-06-17 MED ORDER — IBUPROFEN 200 MG PO TABS: 400.0000 mg | ORAL_TABLET | Freq: Four times a day (QID) | ORAL | 2 refills | Status: AC | PRN

## 2020-06-17 MED ORDER — ACETAMINOPHEN 500 MG PO TABS: 1000.0000 mg | ORAL_TABLET | Freq: Four times a day (QID) | ORAL | 0 refills | Status: AC

## 2020-06-17 NOTE — Discharge Instructions (Signed)
In addition to included general post-operative instructions,  Diet: Resume home diet. Recommend avoiding or limiting fatty/greasy foods over the next few days/week. If you do eat these, you may (or may not) notice diarrhea. This is expected while your body adjusts to not having a gallbladder, and it typically resolves with time.   Activity: No heavy lifting >20 pounds (children, pets, laundry, garbage) for 4 weeks, but light activity and walking are encouraged. Do not drive or drink alcohol if taking narcotic pain medications or having pain that might distract from driving.  Wound care: days after surgery (03/29), you may shower/get incision wet with soapy water and pat dry (do not rub incisions), but no baths or submerging incision underwater until follow-up.   Medications: Resume all home medications. For mild to moderate pain: acetaminophen (Tylenol) or ibuprofen/naproxen (if no kidney disease). Combining Tylenol with alcohol can substantially increase your risk of causing liver disease. Narcotic pain medications, if prescribed, can be used for severe pain, though may cause nausea, constipation, and drowsiness. Do not combine Tylenol and Percocet (or similar) within a 6 hour period as Percocet (and similar) contain(s) Tylenol. If you do not need the narcotic pain medication, you do not need to fill the prescription.  Call office [phone] / [phone]) at any time if any questions, worsening pain, fevers/chills, bleeding, drainage from incision site, or other concerns.

## 2020-06-17 NOTE — Evaluation (Signed)
Occupational Therapy Evaluation Patient Details Name: Tina Holden MRN: [id number] DOB: 03/24/1950 Today's Date: 06/17/2020    History of Present Illness Pt is a 70 y.o. female presenting to hospital 3/25 with concerns for nausea and vomiting and abdominal pain.  S/p ERCP 3/26 and cholecystectomy 3/27.  Pt admitted with choledocholithiasis with obstructive jaundice.  PMH includes arthritis, DM, schizophrenia, seizures.   Clinical Impression   Ms. Vezina presents to OT with generalized weakness and decreased safety awareness that impacts her ability to safely and independently engage in functional tasks.  Prior to admission, pt was able to complete basic ADLs with mod I, using SPC as needed.  Pt lives in a group home that provides assistance for household maintenance tasks and IADLs, including meals, cleaning, medication management, and transportation.  Pt enjoys attending group home activities and doing puzzles (word puzzles, dot to dots, and coloring).  Currently, pt requires contact guard assist for functional mobility with RW and intermittent min A for seated ADLs.  Pt is oriented to setting, month/year/day of week (not specific date), and recent events.  Pt presents with decreased safety awareness and impaired attention/working memory, but is polite and able to follow one step commands.  Ms. Kiger will likely continue to benefit from skilled OT services in acute setting to address functional strengthening and safety and independence in ADLs.  Do not anticipate further OT-related needs after discharge, but recommend discharging back to previous living situation to ensure 24 hour supervision for safety.    Follow Up Recommendations  No OT follow up;Supervision/Assistance - 24 hour (currently receiving supervision from group home, requires supervision for decreased cognition/safety awareness)    Equipment Recommendations  None recommended by OT    Recommendations for Other Services        Precautions / Restrictions Precautions Precautions: Fall Restrictions Weight Bearing Restrictions: No      Mobility Bed Mobility Overal bed mobility: Needs Assistance Bed Mobility: Supine to Sit     Supine to sit: Supervision;HOB elevated     General bed mobility comments: not tested in session Patient Response: Flat affect  Transfers Overall transfer level: Needs assistance Equipment used: Rolling walker (2 wheeled) Transfers: Sit to/from [facility] Sit to Stand: Min guard Stand pivot transfers: Min guard       General transfer comment: mild increased effort to stand but steady; safe descent sitting in recliner with good hand placement; stand step turn with RW bed to recliner    Balance Overall balance assessment: Needs assistance Sitting-balance support: No upper extremity supported;Feet supported Sitting balance-Leahy Scale: Normal Sitting balance - Comments: steady sitting reaching outside BOS   Standing balance support: Bilateral upper extremity supported;During functional activity Standing balance-Leahy Scale: Good Standing balance comment: steady with ambulation using RW                           ADL either performed or assessed with clinical judgement   ADL Overall ADL's : Needs assistance/impaired                                       General ADL Comments: Pt generally requires supervision-min A PRN for ADLs.  Pt able to complete functional mobility with contact guard assist and RW, able to complete seated ADLs with setup/standby assist.     Vision Baseline Vision/History: Wears glasses Wears Glasses: At all times  Patient Visual Report: No change from baseline       Perception     Praxis      Pertinent Vitals/Pain Pain Assessment: No/denies pain Faces Pain Scale: Hurts little more Pain Location: abdomen Pain Descriptors / Indicators: Other (Comment) (reports stinging) Pain Intervention(s):  Limited activity within patient's tolerance;Monitored during session     Hand Dominance     Extremity/Trunk Assessment Upper Extremity Assessment Upper Extremity Assessment: Generalized weakness   Lower Extremity Assessment Lower Extremity Assessment: Generalized weakness   Cervical / Trunk Assessment Cervical / Trunk Assessment: Normal   Communication Communication Communication: Other (comment) (pt's speech difficult to understand at times)   Cognition Arousal/Alertness: Awake/alert Behavior During Therapy: WFL for tasks assessed/performed Overall Cognitive Status: No family/caregiver present to determine baseline cognitive functioning                                 General Comments: Oriented to person, place, and general situation.  Able to follow one-step commands.   General Comments  HR = 69bpm, SpO2 = 94% on RA after ambulation    Exercises Other Exercises Other Exercises: provided education re: OT role and plan of care, fall and safety precautions, self care   Shoulder Instructions      Home Living Family/patient expects to be discharged to:: Group home                                 Additional Comments: Pt reports having 2 ramps to enter 1 level home.      Prior Functioning/Environment Level of Independence: Independent with assistive device(s)        Comments: Pt reports using SPC occasionally when going to church but otherwise does not use any AD; pt reports 1 fall in past 6 months (tripped over cane).        OT Problem List: Decreased strength;Decreased activity tolerance;Decreased range of motion;Decreased cognition;Decreased safety awareness;Decreased knowledge of use of DME or AE      OT Treatment/Interventions: Self-care/ADL training;Therapeutic exercise;Energy conservation;DME and/or AE instruction;Therapeutic activities;Cognitive remediation/compensation;Patient/family education    OT Goals(Current goals can be  found in the care plan section) Acute Rehab OT Goals Patient Stated Goal: to go home OT Goal Formulation: With patient Time For Goal Achievement: 07/01/20 Potential to Achieve Goals: Good  OT Frequency: Min 1X/week   Barriers to D/C:            Co-evaluation              AM-PAC OT "6 Clicks" Daily Activity     Outcome Measure Help from another person eating meals?: None Help from another person taking care of personal grooming?: None Help from another person toileting, which includes using toliet, bedpan, or urinal?: A Little Help from another person bathing (including washing, rinsing, drying)?: A Little Help from another person to put on and taking off regular upper body clothing?: None Help from another person to put on and taking off regular lower body clothing?: None 6 Click Score: 22   End of Session Equipment Utilized During Treatment: Gait belt;Rolling walker  Activity Tolerance: Patient tolerated treatment well Patient left: in chair;with call bell/phone within reach;with chair alarm set  OT Visit Diagnosis: Other abnormalities of gait and mobility (R26.89);Muscle weakness (generalized) (M62.81)                Time: 4742-5956  OT Time Calculation (min): 12 min Charges:  OT General Charges $OT Visit: 1 Visit OT Evaluation $OT Eval Moderate Complexity: 1 Mod  [place], OTR/L 06/17/20, 12:33 PM

## 2020-06-17 NOTE — Progress Notes (Addendum)
[facility] Day(s): 2.   Post op day(s): 1 Day Post-Op.   Interval History:  Patient seen and examined No acute events or new complaints overnight.  Patient reports she is doing well, some abdominal soreness No fever, chills, nausea, emesis Previously seen leukocytosis now resolved; down to 7.4K Maintaining her renal function, sCr - 0.67, UO - 1.1L + unmeasured Expected, stable, LFT elevations Hyperbilirubinemia continues to improve, down to 1.6 She is on full liquid diet; tolerating well Continues on Meropenem   Vital signs in last 24 hours: [min-max] current  Temp:  [98.2 F (36.8 C)-99.5 F (37.5 C)] 99.5 F (37.5 C) (03/27 2039) Pulse Rate:  [55-73] 61 (03/27 2039) Resp:  [15-20] 20 (03/27 2039) BP: (148-168)/(8-74) 152/68 (03/27 2039) SpO2:  [95 %-99 %] 97 % (03/27 2039)     Height: 5\' 7"  (170.2 cm) Weight: 77.4 kg BMI (Calculated): 26.73   Intake/Output last 2 shifts:  03/27 0701 - 03/28 0700 In: 3026.2 [P.O.:1560; I.V.:1118.8; IV Piggyback:347.4] Out: 1100 [Urine:1100]   Physical Exam:  Constitutional: alert, cooperative and no distress, eating breakfast Respiratory: breathing non-labored at rest  Cardiovascular: regular rate and sinus rhythm  Gastrointestinal: soft, non-tender, and non-distended, no rebound/guarding Integumentary: Laparoscopic incisions are CDI with dermabond, no erythema or drainage   Labs:  CBC Latest Ref Rng & Units 06/17/2020 06/15/2020 06/14/2020  WBC 4.0 - 10.5 K/uL 7.4 16.6(H) 13.0(H)  Hemoglobin 12.0 - 15.0 g/dL 12.0 14.1 15.0  Hematocrit 36.0 - 46.0 % 35.3(L) 42.8 46.3(H)  Platelets 150 - 400 K/uL 189 253 194   CMP Latest Ref Rng & Units 06/17/2020 06/15/2020 06/15/2020  Glucose 70 - 99 mg/dL 97 118(H) 124(H)  BUN 8 - 23 mg/dL 10 13 14   Creatinine 0.44 - 1.00 mg/dL 0.67 0.73 0.73  Sodium 135 - 145 mmol/L 137 136 137  Potassium 3.5 - 5.1 mmol/L 3.9 4.2 4.5  Chloride 98 - 111 mmol/L 104  105 103  CO2 22 - 32 mmol/L 27 21(L) 24  Calcium 8.9 - 10.3 mg/dL 8.2(L) 8.8(L) 9.3  Total Protein 6.5 - 8.1 g/dL 5.2(L) 6.4(L) 6.6  Total Bilirubin 0.3 - 1.2 mg/dL 1.6(H) 5.7(H) 4.2(H)  Alkaline Phos 38 - 126 U/L 109 93 91  AST 15 - 41 U/L 114(H) 277(H) 284(H)  ALT 0 - 44 U/L 267(H) 284(H) 252(H)    Imaging studies: No new pertinent imaging studies   Assessment/Plan:  70 y.o. female 1 Day Post-Op s/p robotic assisted laparoscopic cholecystectomy for choledocholithiasis with acute cholecystitis.   - Okay to continue to advance diet as tolerated   - Wean from IVF support   - Continue IV Abx (Meropenem); okay to DC these  - Monitor abdominal examination; on-going bowel function  - Pain control prn; antiemetics prn  - further management per primary service    - General surgery will sign off, I will update DC instructions and place follow up   All of the above findings and recommendations were discussed with the patient, and the medical team, and all of patient's questions were answered to her expressed satisfaction.  -- Edison Simon, PA-C [facility] 06/17/2020, 7:19 AM [phone] M-F: 7am - 4pm

## 2020-06-17 NOTE — Discharge Summary (Addendum)
Physician Discharge Summary  Tina Holden QVZ:[id number] DOB: 1950/07/22 DOA: 06/14/2020  PCP: Derinda Late, MD  Admit date: 06/14/2020 Discharge date: 06/17/2020  Admitted From: group home Disposition:  Group home  Recommendations for Outpatient Follow-up:  Follow up with PCP in 1-2 weeks Please obtain BMP/CBC in one week Please follow up with surgery in 2 weeks Outpatient abdominal MRI recommended as outlined below  Home Health: PT Equipment/Devices: walker recommended (group home has one she can use)  Discharge Condition: Stable  CODE STATUS: Full  Diet recommendation: Regular, as tolerated.   Recommend avoiding greasy or fatty foods for the next week or two,    Discharge Diagnoses: Principal Problem:   Choledocholithiasis Active Problems:   Seizure ([place])   Schizophrenia ([place])   Developmental disability   Leukocytosis   Diabetes ([place])   Essential hypertension    Summary of HPI and Hospital Course:  Tina Holden is a 70 y.o. female with medical history significant of diabetes, schizophrenia, seizure disorder, diabetes not on any medications, osteoarthritis and developmental disability who presented to the ED on evening of 06/14/20 with 1 day of sudden onset of right upper quadrant abdominal pain with some nausea and episode of vomiting.  She was found to have choledocholithiasis.    Admitted to hospitalist service with GI consulted. Underwent ERCP on 3/26 with complete stone removal was successful.  General surgery was consulted and took pt for robotic-assisted lap chole on 3/27.    Treated with IV antibiotics during admission as well.  Postoperatively, patient has done well with pain controlled by Tylenol.   Diet was advance and patient tolerating oral intake well.  She was evaluated by PT and OT, home health therapy was recommended and set up.    General surgery will see the patient in clinic for follow up in about 2 weeks.        Spoke with  sister (guardian) by phone prior to d/c.  All questions answered and confirmed plans for follow up with surgery.        Choledocholithiasis - s/p ERCP and cholecystectomy.  GI has signed off. Continue to advance diet as tolerated.  Avoid fatty/greasy foods for next week or two (may cause some diarrhea).   Pain control with scheduled Tylenol for the next 3-5 days.  May add ibuprofen as needed in pain not adequately controlled with Tylenol.   Seizure disorder - takes Neurontin and Lamictal - resumed. Seizure precautions.   Schizophrenia, Chronic undifferentiated - resume home Seroquel, trazodone, donepezil   Developmental disability - communicates fairly well, functional.  Lives in group home.   PT evaluated and patient remains functionally independent.   Home health PT and OT recommended.   Type 2 Diabetes - diet controlled. Covered with sliding scale Novolog during admission. Mild hyperglycemia but within inpatient goal.   Essential hypertension - appears not on medication.  Stable.   Pancreatic cyst/mass - seen on CT, see report.   Cystic area in the head of the pancreas.  Outpatient MRI is recommended for further evaluation to allow for optimum imaging.   ?Hemagiomas of liver - Seen on CT, see report.   Multiple hyperechoic lesions in the liver consistent with Hemangiomas.      Discharge Instructions  Discharge Instructions     Call MD for:  extreme fatigue   Complete by: As directed    Call MD for:  persistant dizziness or light-headedness   Complete by: As directed    Call MD for:  persistant  nausea and vomiting   Complete by: As directed    Call MD for:  redness, tenderness, or signs of infection (pain, swelling, redness, odor or green/yellow discharge around incision site)   Complete by: As directed    Call MD for:  severe uncontrolled pain   Complete by: As directed    Call MD for:  temperature >100.4   Complete by: As directed    Diet - low sodium heart  healthy   Complete by: As directed    Discharge instructions   Complete by: As directed    For the next 3-5 days, recommend taking 1000 mg Tylenol every 6 to 8 hours.   Do not exceed 4000 of Tylenol in a 24 hour period.  You may combine Tylenol with ibuprofen for better pain control, if needed.   Increase activity slowly   Complete by: As directed       Allergies as of 06/17/2020       Reactions   Asa [aspirin] Other (See Comments)   Unknown   Chocolate Flavor    Codeine Other (See Comments)   unknown   Dilantin [phenytoin Sodium Extended]         Medication List     TAKE these medications    Acetaminophen 325 MG Caps Take 325-650 mg by mouth as needed for mild pain. What changed: Another medication with the same name was added. Make sure you understand how and when to take each.   acetaminophen 500 MG tablet Commonly known as: TYLENOL Take 2 tablets (1,000 mg total) by mouth every 6 (six) hours for 5 days. Then resume as needed use. What changed: You were already taking a medication with the same name, and this prescription was added. Make sure you understand how and when to take each.   calcium-vitamin D 500-200 MG-UNIT tablet Commonly known as: OSCAL WITH D Take 1 tablet by mouth 2 (two) times daily.   Cholecalciferol 125 MCG (5000 UT) capsule Take 5,000 Units by mouth daily.   donepezil 10 MG tablet Commonly known as: ARICEPT Take 10 mg by mouth daily.   famotidine 20 MG tablet Commonly known as: PEPCID Take 20 mg by mouth daily.   fluticasone 50 MCG/ACT nasal spray Commonly known as: FLONASE Place 2 sprays into both nostrils daily.   gabapentin 300 MG capsule Commonly known as: NEURONTIN Take 300 mg by mouth 2 (two) times daily.   ibuprofen 200 MG tablet Commonly known as: Motrin IB Take 2 tablets (400 mg total) by mouth every 6 (six) hours as needed for mild pain or moderate pain.   lamoTRIgine 150 MG tablet Commonly known as: LAMICTAL Take  150 mg by mouth 2 (two) times daily.   nystatin powder Generic drug: nystatin Apply 1 Bottle topically 2 (two) times daily as needed. Apply a small amount   polyethylene glycol 17 g packet Commonly known as: MIRALAX / GLYCOLAX Take 17 g by mouth daily as needed for mild constipation.   QUEtiapine 50 MG Tb24 24 hr tablet Commonly known as: SEROQUEL XR Take 100 mg by mouth at bedtime.   traZODone 100 MG tablet Commonly known as: DESYREL Take 100 mg by mouth at bedtime as needed for sleep.   triamcinolone 0.1 % Commonly known as: KENALOG Apply 1 application topically daily as needed.   vitamin B-12 1000 MCG tablet Commonly known as: CYANOCOBALAMIN Take 1,000 mcg by mouth daily.        Follow-up Information     Edison Simon  R, PA-C. Schedule an appointment as soon as possible for a visit in 2 week(s).   Specialty: Physician Assistant Why: s/p lap cholecystectomy  Contact information: [address] [place] [address] [phone]         [facility] Follow up.   Specialty: Home Health Services Why: They will follow up with you for your home health therapy needs.               Allergies  Allergen Reactions   Asa [Aspirin] Other (See Comments)    Unknown    Chocolate Flavor    Codeine Other (See Comments)    unknown   Dilantin [Phenytoin Sodium Extended]      If you experience worsening of your admission symptoms, develop shortness of breath, life threatening emergency, suicidal or homicidal thoughts you must seek medical attention immediately by calling 911 or calling your MD immediately  if symptoms less severe.    Please note   You were cared for by a hospitalist during your hospital stay. If you have any questions about your discharge medications or the care you received while you were in the hospital after you are discharged, you can call the unit and asked to speak with the hospitalist on call if the  hospitalist that took care of you is not available. Once you are discharged, your primary care physician will handle any further medical issues. Please note that NO REFILLS for any discharge medications will be authorized once you are discharged, as it is imperative that you return to your primary care physician (or establish a relationship with a primary care physician if you do not have one) for your aftercare needs so that they can reassess your need for medications and monitor your lab values.   Consultations: GI General surgery   Procedures/Studies: DG C-Arm 1-60 Min-No Report  Result Date: 06/15/2020 Fluoroscopy was utilized by the requesting physician.  No radiographic interpretation.   US Abdomen Limited RUQ (LIVER/GB)  Result Date: 06/14/2020 CLINICAL DATA:  Epigastric pain EXAM: ULTRASOUND ABDOMEN LIMITED RIGHT UPPER QUADRANT COMPARISON:  None. FINDINGS: Gallbladder: Gallbladder is well distended with multiple gallstones. No wall thickening or pericholecystic fluid is noted. Negative sonographic Murphy's sign is elicited. Common bile duct: Diameter: 8.8 mm Liver: Echogenic foci are noted within the liver consistent with hemangiomas. Portal vein is patent on color Doppler imaging with normal direction of blood flow towards the liver. Note is made of intrahepatic ductal dilatation. Other: 1.5 cm cystic lesion is noted in the head of the pancreas which appears simple in nature. IMPRESSION: Cholelithiasis without evidence of acute cholecystitis. Dilated common bile duct and intrahepatic ductal system suspicious for distal CBD stone. Multiple hyperechoic lesions in the liver consistent with hemangiomas. Cystic area in the head of the pancreas. Outpatient MRI is recommended for further evaluation to allow for optimum imaging. Electronically Signed   By: Inez Catalina M.D.   On: 06/14/2020 23:28    Lap Cholecystectomy ERCP   Subjective: Pt up in recliner when seen.  Reports mild abdominal  pain, feels better after Tylenol. Says she at grits, ice cream, OJ, coffee for breakfast and tolerated without N/V or increased pain. No other acute complaints.    Discharge Exam: Vitals:   06/16/20 2039 06/17/20 1120  BP: (!) 152/68 (!) 141/71  Pulse: 61 (!) 59  Resp: 20 15  Temp: 99.5 F (37.5 C) 99.2 F (37.3 C)  SpO2: 97% 97%   Vitals:   06/16/20 1158 06/16/20  1613 06/16/20 2039 06/17/20 1120  BP: (!) 155/71 (!) 168/67 (!) 152/68 (!) 141/71  Pulse: (!) 55 71 61 (!) 59  Resp: 16 18 20 15   Temp: 98.8 F (37.1 C) 98.6 F (37 C) 99.5 F (37.5 C) 99.2 F (37.3 C)  TempSrc: Oral Oral Oral Oral  SpO2: 97% 98% 97% 97%  Weight:      Height:        General: Pt is alert, awake, not in acute distress Cardiovascular: RRR, S1/S2 +, no rubs, no gallops Respiratory: CTA bilaterally, no wheezing, no rhonchi Abdominal: Soft, post-op tenderness, incisions intact, ND, bowel sounds + Extremities: no edema, no cyanosis    The results of significant diagnostics from this hospitalization (including imaging, microbiology, ancillary and laboratory) are listed below for reference.     Microbiology: Recent Results (from the past 240 hour(s))  Resp Panel by RT-PCR (Flu A&B, Covid) Nasopharyngeal Swab     Status: None   Collection Time: 06/15/20 12:06 AM   Specimen: Nasopharyngeal Swab; Nasopharyngeal(NP) swabs in vial transport medium  Result Value Ref Range Status   SARS Coronavirus 2 by RT PCR NEGATIVE NEGATIVE Final    Comment: (NOTE) SARS-CoV-2 target nucleic acids are NOT DETECTED.  The SARS-CoV-2 RNA is generally detectable in upper respiratory specimens during the acute phase of infection. The lowest concentration of SARS-CoV-2 viral copies this assay can detect is 138 copies/mL. A negative result does not preclude SARS-Cov-2 infection and should not be used as the sole basis for treatment or other patient management decisions. A negative result may occur with  improper  specimen collection/handling, submission of specimen other than nasopharyngeal swab, presence of viral mutation(s) within the areas targeted by this assay, and inadequate number of viral copies(<138 copies/mL). A negative result must be combined with clinical observations, patient history, and epidemiological information. The expected result is Negative.  Fact Sheet for Patients:  EntrepreneurPulse.com.au  Fact Sheet for Healthcare Providers:  IncredibleEmployment.be  This test is no t yet approved or cleared by the [place] FDA and  has been authorized for detection and/or diagnosis of SARS-CoV-2 by FDA under an Emergency Use Authorization (EUA). This EUA will remain  in effect (meaning this test can be used) for the duration of the COVID-19 declaration under Section 564(b)(1) of the Act, 21 U.S.C.section 360bbb-3(b)(1), unless the authorization is terminated  or revoked sooner.       Influenza A by PCR NEGATIVE NEGATIVE Final   Influenza B by PCR NEGATIVE NEGATIVE Final    Comment: (NOTE) The Xpert Xpress SARS-CoV-2/FLU/RSV plus assay is intended as an aid in the diagnosis of influenza from Nasopharyngeal swab specimens and should not be used as a sole basis for treatment. Nasal washings and aspirates are unacceptable for Xpert Xpress SARS-CoV-2/FLU/RSV testing.  Fact Sheet for Patients: EntrepreneurPulse.com.au  Fact Sheet for Healthcare Providers: IncredibleEmployment.be  This test is not yet approved or cleared by the [place] FDA and has been authorized for detection and/or diagnosis of SARS-CoV-2 by FDA under an Emergency Use Authorization (EUA). This EUA will remain in effect (meaning this test can be used) for the duration of the COVID-19 declaration under Section 564(b)(1) of the Act, 21 U.S.C. section 360bbb-3(b)(1), unless the authorization is terminated or revoked.  Performed at  [facility], [address] [place], [place] [address]      Labs: BNP (last 3 results) No results for input(s): BNP in the last 8760 hours. Basic Metabolic Panel: Recent Labs  Lab 06/14/20 2020  06/15/20 0513 06/15/20 1036 06/17/20 0445  NA 137 137 136 137  K 4.0 4.5 4.2 3.9  CL 101 103 105 104  CO2 24 24 21* 27  GLUCOSE 165* 124* 118* 97  BUN 16 14 13 10   CREATININE 0.80 0.73 0.73 0.67  CALCIUM 9.4 9.3 8.8* 8.2*   Liver Function Tests: Recent Labs  Lab 06/14/20 2020 06/15/20 0513 06/15/20 1036 06/17/20 0445  AST 203* 284* 277* 114*  ALT 132* 252* 284* 267*  ALKPHOS 93 91 93 109  BILITOT 2.9* 4.2* 5.7* 1.6*  PROT 7.4 6.6 6.4* 5.2*  ALBUMIN 4.7 4.0 3.7 3.0*   Recent Labs  Lab 06/14/20 2020  LIPASE 55*   No results for input(s): AMMONIA in the last 168 hours. CBC: Recent Labs  Lab 06/14/20 2020 06/15/20 0513 06/17/20 0445  WBC 13.0* 16.6* 7.4  NEUTROABS 11.9*  --   --   HGB 15.0 14.1 12.0  HCT 46.3* 42.8 35.3*  MCV 92.4 92.0 90.7  PLT 194 253 189   Cardiac Enzymes: No results for input(s): CKTOTAL, CKMB, CKMBINDEX, TROPONINI in the last 168 hours. BNP: Invalid input(s): POCBNP CBG: Recent Labs  Lab 06/16/20 1049 06/16/20 1548 06/16/20 2211 06/17/20 0806 06/17/20 1225  GLUCAP 123* 103* 126* 100* 100*   D-Dimer No results for input(s): DDIMER in the last 72 hours. Hgb A1c Recent Labs    06/15/20 0513  HGBA1C 5.2   Lipid Profile No results for input(s): CHOL, HDL, LDLCALC, TRIG, CHOLHDL, LDLDIRECT in the last 72 hours. Thyroid function studies No results for input(s): TSH, T4TOTAL, T3FREE, THYROIDAB in the last 72 hours.  Invalid input(s): FREET3 Anemia work up No results for input(s): VITAMINB12, FOLATE, FERRITIN, TIBC, IRON, RETICCTPCT in the last 72 hours. Urinalysis    Component Value Date/Time   COLORURINE AMBER (A) 06/15/2020 0540   APPEARANCEUR CLOUDY (A) 06/15/2020 0540   APPEARANCEUR Clear 08/30/2013 0040    LABSPEC 1.023 06/15/2020 0540   LABSPEC 1.018 08/30/2013 0040   PHURINE 7.0 06/15/2020 0540   GLUCOSEU NEGATIVE 06/15/2020 0540   GLUCOSEU Negative 08/30/2013 0040   HGBUR NEGATIVE 06/15/2020 0540   BILIRUBINUR NEGATIVE 06/15/2020 0540   BILIRUBINUR Negative 08/30/2013 0040   KETONESUR NEGATIVE 06/15/2020 0540   PROTEINUR 30 (A) 06/15/2020 0540   NITRITE NEGATIVE 06/15/2020 0540   LEUKOCYTESUR NEGATIVE 06/15/2020 0540   LEUKOCYTESUR Negative 08/30/2013 0040   Sepsis Labs Invalid input(s): PROCALCITONIN,  WBC,  LACTICIDVEN Microbiology Recent Results (from the past 240 hour(s))  Resp Panel by RT-PCR (Flu A&B, Covid) Nasopharyngeal Swab     Status: None   Collection Time: 06/15/20 12:06 AM   Specimen: Nasopharyngeal Swab; Nasopharyngeal(NP) swabs in vial transport medium  Result Value Ref Range Status   SARS Coronavirus 2 by RT PCR NEGATIVE NEGATIVE Final    Comment: (NOTE) SARS-CoV-2 target nucleic acids are NOT DETECTED.  The SARS-CoV-2 RNA is generally detectable in upper respiratory specimens during the acute phase of infection. The lowest concentration of SARS-CoV-2 viral copies this assay can detect is 138 copies/mL. A negative result does not preclude SARS-Cov-2 infection and should not be used as the sole basis for treatment or other patient management decisions. A negative result may occur with  improper specimen collection/handling, submission of specimen other than nasopharyngeal swab, presence of viral mutation(s) within the areas targeted by this assay, and inadequate number of viral copies(<138 copies/mL). A negative result must be combined with clinical observations, patient history, and epidemiological information. The expected result is Negative.  Fact  Sheet for Patients:  EntrepreneurPulse.com.au  Fact Sheet for Healthcare Providers:  IncredibleEmployment.be  This test is no t yet approved or cleared by the [place]  FDA and  has been authorized for detection and/or diagnosis of SARS-CoV-2 by FDA under an Emergency Use Authorization (EUA). This EUA will remain  in effect (meaning this test can be used) for the duration of the COVID-19 declaration under Section 564(b)(1) of the Act, 21 U.S.C.section 360bbb-3(b)(1), unless the authorization is terminated  or revoked sooner.       Influenza A by PCR NEGATIVE NEGATIVE Final   Influenza B by PCR NEGATIVE NEGATIVE Final    Comment: (NOTE) The Xpert Xpress SARS-CoV-2/FLU/RSV plus assay is intended as an aid in the diagnosis of influenza from Nasopharyngeal swab specimens and should not be used as a sole basis for treatment. Nasal washings and aspirates are unacceptable for Xpert Xpress SARS-CoV-2/FLU/RSV testing.  Fact Sheet for Patients: EntrepreneurPulse.com.au  Fact Sheet for Healthcare Providers: IncredibleEmployment.be  This test is not yet approved or cleared by the [place] FDA and has been authorized for detection and/or diagnosis of SARS-CoV-2 by FDA under an Emergency Use Authorization (EUA). This EUA will remain in effect (meaning this test can be used) for the duration of the COVID-19 declaration under Section 564(b)(1) of the Act, 21 U.S.C. section 360bbb-3(b)(1), unless the authorization is terminated or revoked.  Performed at [facility], [address] [place],  [address]      Time coordinating discharge: Over 30 minutes  SIGNED:   Ezekiel Slocumb, DO [facility] 06/17/2020, 12:39 PM   If 7PM-7AM, please contact night-coverage [email or web address]

## 2020-06-17 NOTE — Evaluation (Signed)
Physical Therapy Evaluation Patient Details Name: Tina Holden MRN: [id number] DOB: [birth year] Today's Date: 06/17/2020   History of Present Illness  Pt is a 70 y.o. female presenting to hospital 3/25 with concerns for nausea and vomiting and abdominal pain.  S/p ERCP 3/26 and cholecystectomy 3/27.  Pt admitted with choledocholithiasis with obstructive jaundice.  PMH includes arthritis, DM, schizophrenia, seizures.  Clinical Impression  Prior to hospital admission, pt reports being ambulatory (used SPC occasionally when going to church); lives at a group home.  Pt oriented to person, place, and general situation (time not assessed).  Currently pt is SBA semi-supine to sitting edge of bed; CGA with transfers; and CGA with ambulation 200 feet with RW.  Pt requesting to use RW during session for stability d/t generalized feeling of weakness.  No loss of balance noted during sessions activities.  Pt would benefit from skilled PT to address noted impairments and functional limitations (see below for any additional details).  Upon hospital discharge, pt would benefit from [place].    Follow Up Recommendations Home health PT    Equipment Recommendations  Rolling walker with 5" wheels    Recommendations for Other Services OT consult     Precautions / Restrictions Precautions Precautions: Fall Restrictions Weight Bearing Restrictions: No      Mobility  Bed Mobility Overal bed mobility: Needs Assistance Bed Mobility: Supine to Sit     Supine to sit: Supervision;HOB elevated     General bed mobility comments: mild increased effort to perform on own    Transfers Overall transfer level: Needs assistance Equipment used: Rolling walker (2 wheeled) Transfers: Sit to/from [facility] Sit to Stand: Min guard Stand pivot transfers: Min guard       General transfer comment: mild increased effort to stand but steady; safe descent sitting in recliner with good hand  placement; stand step turn with RW bed to recliner  Ambulation/Gait Ambulation/Gait assistance: Min guard Gait Distance (Feet): 200 Feet Assistive device: Rolling walker (2 wheeled) Gait Pattern/deviations: Step-through pattern Gait velocity: mildly decreased   General Gait Details: steady ambulation with RW use  Stairs            Wheelchair Mobility    Modified Rankin (Stroke Patients Only)       Balance Overall balance assessment: Needs assistance Sitting-balance support: No upper extremity supported;Feet supported Sitting balance-Leahy Scale: Normal Sitting balance - Comments: steady sitting reaching outside BOS   Standing balance support: Bilateral upper extremity supported;During functional activity Standing balance-Leahy Scale: Good Standing balance comment: steady with ambulation using RW                             Pertinent Vitals/Pain Pain Assessment: Faces Faces Pain Scale: Hurts little more Pain Location: abdomen Pain Descriptors / Indicators: Other (Comment) (stings) Pain Intervention(s): Limited activity within patient's tolerance;Monitored during session;Repositioned  Vitals (HR and O2 on room air) stable and WFL throughout treatment session.    Home Living Family/patient expects to be discharged to:: Group home                 Additional Comments: Pt reports having 2 ramps to enter 1 level home.    Prior Function Level of Independence: Independent with assistive device(s)         Comments: Pt reports using SPC occasionally when going to church but otherwise does not use any AD; pt reports 1 fall in past 6 months (tripped over  cane).     Hand Dominance        Extremity/Trunk Assessment   Upper Extremity Assessment Upper Extremity Assessment: Defer to OT evaluation    Lower Extremity Assessment Lower Extremity Assessment: Generalized weakness    Cervical / Trunk Assessment Cervical / Trunk Assessment: Normal   Communication   Communication: Other (comment) (pt's speech difficult to understand at times)  Cognition Arousal/Alertness: Awake/alert Behavior During Therapy: WFL for tasks assessed/performed Overall Cognitive Status: No family/caregiver present to determine baseline cognitive functioning                                 General Comments: Oriented to person, place, and general situation.      General Comments   Nursing cleared pt for participation in physical therapy.  Pt agreeable to PT session.    Exercises     Assessment/Plan    PT Assessment Patient needs continued PT services  PT Problem List Decreased strength;Decreased balance;Decreased activity tolerance;Decreased mobility;Decreased knowledge of use of DME       PT Treatment Interventions DME instruction;Gait training;Functional mobility training;Therapeutic activities;Therapeutic exercise;Balance training;Patient/family education    PT Goals (Current goals can be found in the Care Plan section)  Acute Rehab PT Goals Patient Stated Goal: to go home PT Goal Formulation: With patient Time For Goal Achievement: 07/01/20 Potential to Achieve Goals: Good    Frequency Min 2X/week   Barriers to discharge        Co-evaluation               AM-PAC PT "6 Clicks" Mobility  Outcome Measure Help needed turning from your back to your side while in a flat bed without using bedrails?: None Help needed moving from lying on your back to sitting on the side of a flat bed without using bedrails?: A Little Help needed moving to and from a bed to a chair (including a wheelchair)?: A Little Help needed standing up from a chair using your arms (e.g., wheelchair or bedside chair)?: A Little Help needed to walk in hospital room?: A Little Help needed climbing 3-5 steps with a railing? : A Little 6 Click Score: 19    End of Session Equipment Utilized During Treatment: Gait belt (up high) Activity Tolerance:  Patient tolerated treatment well Patient left: in chair (with OT present for OT evaluation) Nurse Communication: Mobility status;Precautions PT Visit Diagnosis: Muscle weakness (generalized) (M62.81);History of falling (Z91.81);Other abnormalities of gait and mobility (R26.89)    Time: 0950-1009 PT Time Calculation (min) (ACUTE ONLY): 19 min   Charges:   PT Evaluation $PT Eval Low Complexity: 1 Low PT Treatments $Gait Training: 8-22 mins       Thao Bauza, PT 06/17/20, 10:30 AM

## 2020-06-17 NOTE — TOC Transition Note (Signed)
Transition of Care [facility]) - CM/SW Discharge Note   Patient Details  Name: Corrin Sieling MRN: [id number] Date of Birth: [birth year]  [facility] of [facility]) CM/SW Contact:  Candie Chroman, LCSW Phone Number: 06/17/2020, 1:52 PM   Clinical Narrative: Patient has orders to discharge back to [facility] today. RN will call facility to coordinate a time for pickup. Called and updated patient's sister. No further concerns. CSW signing off.  Final next level of care: Assisted Living (with home health) Barriers to Discharge: Barriers Resolved   Patient Goals and CMS Choice     Choice offered to / list presented to : [facility] POA / Guardian,Sibling  Discharge Placement                Patient to be transferred to facility by: ALF will pick her up Name of family member notified: Rosalva Ferron Patient and family notified of of transfer: 06/17/20  Discharge Plan and Services     Post Acute Care Choice: [facility]: PT,OT [facility] Agency: [place] ([place]) Date [place]: 06/17/20   Representative spoke with at [place]: Floydene Flock  Social Determinants of Health ([place]) Interventions     Readmission Risk Interventions No flowsheet data found.

## 2020-06-17 NOTE — TOC Initial Note (Addendum)
Transition of Care [facility]) - Initial/Assessment Note    Patient Details  Name: Tina Holden MRN: [id number] Date of Birth: [birth year]  Transition of Care [facility]) CM/SW Contact:    Candie Chroman, LCSW Phone Number: 06/17/2020, 11:17 AM  Clinical Narrative: CSW called patient's sister/legal guardian, introduced role, and explained that PT recommendations would be discussed. She is agreeable to HHPT. No agency preference. Patient lives at [facility]. CSW spoke to staff member [phone]) who stated they also do not have home health agency preference. They have a walker that patient can use. ALF staff will pick her up with discharged. No further concerns. CSW encouraged patient's sister to contact CSW as needed. CSW will continue to follow patient for support and facilitate return to group home when discharged.    12:06 pm: Made referral to [place] representative for review.      12:21 pm: [facility] can accept patient for PT and OT. No nursing needs identified.  1:04 pm: FL2 and discharge summary faxed to ALF.  Expected Discharge Plan: Group Home Barriers to Discharge: Continued Medical Work up   Patient Goals and CMS Choice        Expected Discharge Plan and Services Expected Discharge Plan: Group Home     Post Acute Care Choice: [place] arrangements for the past 2 months: Group Home                                      Prior Living Arrangements/Services Living arrangements for the past 2 months: Group Home Lives with:: Facility Resident Patient language and need for interpreter reviewed:: Yes Do you feel safe going back to the place where you live?: Yes      Need for Family Participation in Patient Care: Yes (Comment) Care giver support system in place?: Yes (comment) Current home services: DME Criminal Activity/Legal Involvement Pertinent to Current Situation/Hospitalization: No - Comment as needed  Activities of  Daily Living Home Assistive Devices/Equipment: Shower chair with back ADL Screening (condition at time of admission) Patient's cognitive ability adequate to safely complete daily activities?: Yes Is the patient deaf or have difficulty hearing?: No Does the patient have difficulty seeing, even when wearing glasses/contacts?: No Does the patient have difficulty concentrating, remembering, or making decisions?: Yes Patient able to express need for assistance with ADLs?: Yes Does the patient have difficulty dressing or bathing?: Yes Independently performs ADLs?: Yes (appropriate for developmental age) Does the patient have difficulty walking or climbing stairs?: No Weakness of Legs: None Weakness of Arms/Hands: None  Permission Sought/Granted Permission sought to share information with : Facility Contact Representative,Family Supports    Share Information with NAME: Therapist, nutritional  Permission granted to share info w AGENCY: [facility]  Permission granted to share info w Relationship: Sister/legal guardian  Permission granted to share info w Contact Information: [phone]  Emotional Assessment Appearance:: Appears stated age Attitude/Demeanor/Rapport: Unable to Assess Affect (typically observed): Unable to Assess Orientation: : Oriented to Self,Oriented to Place,Oriented to  Time,Oriented to Situation Alcohol / Substance Use: Not Applicable Psych Involvement: No (comment)  Admission diagnosis:  Choledocholithiasis [K80.50] Epigastric abdominal pain [R10.13] Patient Active Problem List   Diagnosis Date Noted  . Diabetes ([place]) 06/15/2020  . Essential hypertension 06/15/2020  . Choledocholithiasis 06/15/2020  . Acute cystitis without hematuria 12/14/2014  . Leukocytosis 12/14/2014  . Schizophrenia ([place]) 12/13/2014  . Acute delirium 12/13/2014  .  Developmental disability 12/13/2014  . Seizure ([place]) 12/11/2014   PCP:  Derinda Late, MD Pharmacy:   [facility], [place] - [place] [address] [place] [address] Phone: [phone] Fax: [phone]     Social Determinants of Health (SDOH) Interventions    Readmission Risk Interventions No flowsheet data found.

## 2020-06-17 NOTE — NC FL2 (Addendum)
[place] LEVEL OF CARE SCREENING TOOL     IDENTIFICATION  Patient Name: Tina Holden Birthdate: 11/01/1950 Sex: female Admission Date (Current Location): 06/14/2020  [place] and [place] Number:  Engineering geologist and Address:  [facility] [address] [place], [place] [address]      Provider Number: [id number]  Attending Physician Name and Address:  Ezekiel Slocumb, DO  Relative Name and Phone Number:       Current Level of Care: Hospital Recommended Level of Care: [employer] (with PT and OT through [facility]) Prior Approval Number:    Date Approved/Denied:   PASRR Number:    Discharge Plan: Other (Comment) (ALF with PT and OT through [place])    Current Diagnoses: Patient Active Problem List   Diagnosis Date Noted  . Diabetes ([place]) 06/15/2020  . Essential hypertension 06/15/2020  . Choledocholithiasis 06/15/2020  . Acute cystitis without hematuria 12/14/2014  . Leukocytosis 12/14/2014  . Schizophrenia ([place]) 12/13/2014  . Acute delirium 12/13/2014  . Developmental disability 12/13/2014  . Seizure ([place]) 12/11/2014    Orientation RESPIRATION BLADDER Height & Weight     Self,Time,Situation,Place  Normal Continent Weight: 170 lb 11.2 oz (77.4 kg) Height:  5\' 7"  (170.2 cm)  BEHAVIORAL SYMPTOMS/MOOD NEUROLOGICAL BOWEL NUTRITION STATUS   (None)   Continent Diet (Regular)  AMBULATORY STATUS COMMUNICATION OF NEEDS Skin   Limited Assist Verbally Normal                       Personal Care Assistance Level of Assistance  Bathing,Feeding,Dressing Bathing Assistance: Limited assistance Feeding assistance: Limited assistance Dressing Assistance: Limited assistance     Functional Limitations Info  Sight,Hearing,Speech Sight Info: Adequate Hearing Info: Adequate Speech Info: Adequate    SPECIAL CARE FACTORS FREQUENCY  PT (By licensed PT),OT (By licensed OT)     PT  Frequency: 3 x week OT Frequency: 3 x week            Contractures Contractures Info: Not present    Additional Factors Info  Code Status,Allergies,Psychotropic Code Status Info: Full code Allergies Info: Asa (aspirin), Chocolate flavor, Codeine, Dilantin (Phenytoin Sodium Extended) Psychotropic Info: Schizophrenia         Current Medications (06/17/2020):  This is the current hospital active medication list Current Facility-Administered Medications  Medication Dose Route Frequency Provider Last Rate Last Admin  . acetaminophen (TYLENOL) tablet 1,000 mg  1,000 mg Oral Q6H Pabon, Diego F, MD   1,000 mg at 06/17/20 0540  . donepezil (ARICEPT) tablet 10 mg  10 mg Oral Daily Nicole Kindred A, DO   10 mg at 06/17/20 0943  . famotidine (PEPCID) tablet 20 mg  20 mg Oral Daily Nicole Kindred A, DO   20 mg at 06/17/20 0943  . fluticasone (FLONASE) 50 MCG/ACT nasal spray 2 spray  2 spray Each Nare Daily Nicole Kindred A, DO   2 spray at 06/17/20 0941  . gabapentin (NEURONTIN) capsule 300 mg  300 mg Oral BID Nicole Kindred A, DO   300 mg at 06/17/20 0943  . HYDROmorphone (DILAUDID) injection 0.5-1 mg  0.5-1 mg Intravenous Q2H PRN Pabon, Diego F, MD      . insulin aspart (novoLOG) injection 0-5 Units  0-5 Units Subcutaneous QHS Pabon, Diego F, MD      . insulin aspart (novoLOG) injection 0-9 Units  0-9 Units Subcutaneous TID WC Pabon, Marjory Lies, MD      .  lactated ringers infusion   Intravenous Continuous Pabon, [place] F, MD 100 mL/hr at 06/17/20 1037 New Bag at 06/17/20 1037  . lamoTRIgine (LAMICTAL) tablet 150 mg  150 mg Oral BID Nicole Kindred A, DO   150 mg at 06/17/20 2751  . metoprolol tartrate (LOPRESSOR) injection 5 mg  5 mg Intravenous Q6H PRN Pabon, Diego F, MD      . oxyCODONE (Oxy IR/ROXICODONE) immediate release tablet 5 mg  5 mg Oral Q3H PRN Pabon, Diego F, MD      . polyethylene glycol (MIRALAX / GLYCOLAX) packet 17 g  17 g Oral Daily PRN Nicole Kindred A, DO      . QUEtiapine  (SEROQUEL XR) 24 hr tablet 100 mg  100 mg Oral QHS Nicole Kindred A, DO   100 mg at 06/16/20 2056  . traZODone (DESYREL) tablet 100 mg  100 mg Oral QHS PRN Ezekiel Slocumb, DO         Discharge Medications: TAKE these medications   Acetaminophen 325 MG Caps Take 325-650 mg by mouth as needed for mild pain. What changed: Another medication with the same name was added. Make sure you understand how and when to take each.   acetaminophen 500 MG tablet Commonly known as: TYLENOL Take 2 tablets (1,000 mg total) by mouth every 6 (six) hours for 5 days. Then resume as needed use. What changed: You were already taking a medication with the same name, and this prescription was added. Make sure you understand how and when to take each.   calcium-vitamin D 500-200 MG-UNIT tablet Commonly known as: OSCAL WITH D Take 1 tablet by mouth 2 (two) times daily.   Cholecalciferol 125 MCG (5000 UT) capsule Take 5,000 Units by mouth daily.   donepezil 10 MG tablet Commonly known as: ARICEPT Take 10 mg by mouth daily.   famotidine 20 MG tablet Commonly known as: PEPCID Take 20 mg by mouth daily.   fluticasone 50 MCG/ACT nasal spray Commonly known as: FLONASE Place 2 sprays into both nostrils daily.   gabapentin 300 MG capsule Commonly known as: NEURONTIN Take 300 mg by mouth 2 (two) times daily.   ibuprofen 200 MG tablet Commonly known as: Motrin IB Take 2 tablets (400 mg total) by mouth every 6 (six) hours as needed for mild pain or moderate pain.   lamoTRIgine 150 MG tablet Commonly known as: LAMICTAL Take 150 mg by mouth 2 (two) times daily.   nystatin powder Generic drug: nystatin Apply 1 Bottle topically 2 (two) times daily as needed. Apply a small amount   polyethylene glycol 17 g packet Commonly known as: MIRALAX / GLYCOLAX Take 17 g by mouth daily as needed for mild constipation.   QUEtiapine 50 MG Tb24 24 hr tablet Commonly known as: SEROQUEL XR Take 100 mg  by mouth at bedtime.   traZODone 100 MG tablet Commonly known as: DESYREL Take 100 mg by mouth at bedtime as needed for sleep.   triamcinolone 0.1 % Commonly known as: KENALOG Apply 1 application topically daily as needed.   vitamin B-12 1000 MCG tablet Commonly known as: CYANOCOBALAMIN Take 1,000 mcg by mouth daily.     Relevant Imaging Results:  Relevant Lab Results:   Additional Information SS#: [id number]  Candie Chroman, LCSW

## 2020-06-18 LAB — SURGICAL PATHOLOGY

## 2020-06-24 ENCOUNTER — Other Ambulatory Visit (HOSPITAL_COMMUNITY): Payer: Self-pay | Admitting: Family Medicine

## 2020-06-24 ENCOUNTER — Other Ambulatory Visit: Payer: Self-pay | Admitting: Family Medicine

## 2020-06-24 DIAGNOSIS — R1900 Intra-abdominal and pelvic swelling, mass and lump, unspecified site: Secondary | ICD-10-CM

## 2020-07-04 ENCOUNTER — Ambulatory Visit
Admission: RE | Admit: 2020-07-04 | Discharge: 2020-07-04 | Disposition: A | Discharge status: Home / Self Care | Payer: Medicare Other | Source: Ambulatory Visit | Attending: Family Medicine | Admitting: Family Medicine

## 2020-07-04 ENCOUNTER — Other Ambulatory Visit: Payer: Self-pay

## 2020-07-04 ENCOUNTER — Ambulatory Visit (INDEPENDENT_AMBULATORY_CARE_PROVIDER_SITE_OTHER): Payer: Medicare Other | Admitting: Physician Assistant

## 2020-07-04 ENCOUNTER — Encounter: Payer: Self-pay | Admitting: Physician Assistant

## 2020-07-04 VITALS — BP 130/72 | HR 89 | Temp 98.4°F | Ht 67.0 in | Wt 166.0 lb

## 2020-07-04 DIAGNOSIS — K8050 Calculus of bile duct without cholangitis or cholecystitis without obstruction: Secondary | ICD-10-CM

## 2020-07-04 DIAGNOSIS — R1900 Intra-abdominal and pelvic swelling, mass and lump, unspecified site: Secondary | ICD-10-CM | POA: Diagnosis not present

## 2020-07-04 DIAGNOSIS — Z09 Encounter for follow-up examination after completed treatment for conditions other than malignant neoplasm: Secondary | ICD-10-CM

## 2020-07-04 MED ORDER — GADOBUTROL 1 MMOL/ML IV SOLN
7.0000 mL | Freq: Once | INTRAVENOUS | Status: AC | PRN
  Administered 2020-07-04: 7 mL via INTRAVENOUS

## 2020-07-04 NOTE — Patient Instructions (Addendum)
Follow-up with our office as needed.  Please call and ask to speak with a nurse if you develop questions or concerns.   GENERAL POST-OPERATIVE PATIENT INSTRUCTIONS   WOUND CARE INSTRUCTIONS: Try to keep the wound dry and avoid ointments on the wound unless directed to do so.  If the wound becomes bright red and painful or starts to drain infected material that is not clear, please contact your physician immediately.  If the wound is mildly pink and has a thick firm ridge underneath it, this is normal, and is referred to as a healing ridge.  This will resolve over the next 4-6 weeks.  BATHING: You may shower if you have been informed of this by your surgeon. However, Please do not submerge in a tub, hot tub, or pool until incisions are completely sealed or have been told by your surgeon that you may do so.  DIET:  You may eat any foods that you can tolerate.  It is a good idea to eat a high fiber diet and take in plenty of fluids to prevent constipation.  If you do have diarrhea you may use some Imodium to help. This should get better with time.   ACTIVITY:  You may want to hug a pillow when coughing and sneezing to add additional support to the surgical area, if you had abdominal or chest surgery, which will decrease pain during these times.  You are encouraged to walk and engage in light activity for the next two weeks.  You should not lift more than 20 pounds for 4-6 weeks after surgery as it could put you at increased risk for complications.  Twenty pounds is roughly equivalent to a plastic bag of groceries. At that time- Listen to your body when lifting, if you have pain when lifting, stop and then try again in a few days. Soreness after doing exercises or activities of daily living is normal as you get back in to your normal routine.  MEDICATIONS:  Try to take narcotic medications and anti-inflammatory medications, such as tylenol, ibuprofen, naprosyn, etc., with food.  This will minimize  stomach upset from the medication.  Should you develop nausea and vomiting from the pain medication, or develop a rash, please discontinue the medication and contact your physician.  You should not drive, make important decisions, or operate machinery when taking narcotic pain medication.  SUNBLOCK Use sun block to incision area over the next year if this area will be exposed to sun. This helps decrease scarring and will allow you avoid a permanent darkened area over your incision.  QUESTIONS:  Please feel free to call our office if you have any questions, and we will be glad to assist you.

## 2020-07-04 NOTE — Progress Notes (Signed)
[facility] POST-OP OFFICE VISIT  07/04/2020  HPI: Tina Holden is a 70 y.o. female 17 days s/p robotic assisted laparoscopic cholecystectomy for choledocholithiasis with acute cholecystitis with Dr Dahlia Byes  She is overall doing well No issues with pain, fever, chills, nausea She is having some diarrhea since surgery No issues with incisions Otherwise doing well  Vital signs: BP 130/72   Pulse 89   Temp 98.4 F (36.9 C)   Ht 5\' 7"  (1.702 m)   Wt 166 lb (75.3 kg)   SpO2 97%   BMI 26.00 kg/m    Physical Exam: Constitutional: Well appearing female, NAD Abdomen: Soft, non-tender, non-distended, no rebound/guarding Skin: Laparoscopic incisions have healed well, no erythema or drianage  Assessment/Plan: This is a 70 y.o. female 17 days s/p robotic assisted laparoscopic cholecystectomy for choledocholithiasis with acute cholecystitis    - Pain control prn; Tylenol/Motirn  - Imodium PRN for diarrhea; recommend low fat diet  - Reviewed wound care  - Reviewed lifting restrictions  - Reviewed surgical pathology: CCC, negative for malignancy   - Follow up MRI from today with PCP  - She can return to surgery clinic as needed  -- Edison Simon, PA-C [facility] 07/04/2020, 10:27 AM [phone] M-F: 7am - 4pm

## 2020-10-04 ENCOUNTER — Other Ambulatory Visit: Payer: Self-pay | Admitting: Family Medicine

## 2020-10-04 DIAGNOSIS — Z1231 Encounter for screening mammogram for malignant neoplasm of breast: Secondary | ICD-10-CM

## 2020-10-11 ENCOUNTER — Other Ambulatory Visit: Payer: Self-pay

## 2020-10-11 ENCOUNTER — Ambulatory Visit
Admission: RE | Admit: 2020-10-11 | Discharge: 2020-10-11 | Disposition: A | Discharge status: Home / Self Care | Payer: Medicare Other | Source: Ambulatory Visit | Attending: Family Medicine | Admitting: Family Medicine

## 2020-10-11 DIAGNOSIS — Z1231 Encounter for screening mammogram for malignant neoplasm of breast: Secondary | ICD-10-CM | POA: Diagnosis not present

## 2021-02-05 NOTE — Addendum Note (Signed)
Encounter addended by: Annie Paras on: 02/05/2021 2:58 PM  Actions taken: Letter saved

## 2021-04-16 ENCOUNTER — Other Ambulatory Visit: Payer: Self-pay | Admitting: Family Medicine

## 2021-04-16 DIAGNOSIS — K869 Disease of pancreas, unspecified: Secondary | ICD-10-CM

## 2021-04-16 DIAGNOSIS — R9389 Abnormal findings on diagnostic imaging of other specified body structures: Secondary | ICD-10-CM

## 2021-04-18 ENCOUNTER — Other Ambulatory Visit: Payer: Self-pay

## 2021-04-18 ENCOUNTER — Ambulatory Visit
Admission: RE | Admit: 2021-04-18 | Discharge: 2021-04-18 | Disposition: A | Discharge status: Home / Self Care | Payer: Medicare Other | Source: Ambulatory Visit | Attending: Family Medicine | Admitting: Family Medicine

## 2021-04-18 DIAGNOSIS — R9389 Abnormal findings on diagnostic imaging of other specified body structures: Secondary | ICD-10-CM | POA: Diagnosis present

## 2021-04-18 DIAGNOSIS — K869 Disease of pancreas, unspecified: Secondary | ICD-10-CM

## 2021-09-05 ENCOUNTER — Other Ambulatory Visit: Payer: Self-pay | Admitting: Family Medicine

## 2021-09-05 DIAGNOSIS — Z1231 Encounter for screening mammogram for malignant neoplasm of breast: Secondary | ICD-10-CM

## 2021-10-14 ENCOUNTER — Ambulatory Visit
Admission: RE | Admit: 2021-10-14 | Discharge: 2021-10-14 | Disposition: A | Discharge status: Home / Self Care | Payer: Medicare Other | Source: Ambulatory Visit | Attending: Family Medicine | Admitting: Family Medicine

## 2021-10-14 DIAGNOSIS — Z1231 Encounter for screening mammogram for malignant neoplasm of breast: Secondary | ICD-10-CM

## 2021-11-27 IMAGING — MR MR ABDOMEN WO/W CM
17 of 21 series · 40 of 48 positions shown · IV contrast (gadavist)
Comparison: Ultrasound examination 06/15/2020

CLINICAL DATA: Evaluate pancreatic cysts seen on recent ultrasound.

EXAM:
MRI ABDOMEN WITHOUT AND WITH CONTRAST
TECHNIQUE: Multiplanar multisequence MR imaging of the abdomen was performed
both before and after the administration of intravenous contrast.
CONTRAST:  7mL GADAVIST GADOBUTROL 1 MMOL/ML IV SOLN

[Series 3: T2 · coronal · 6.0mm · 1.19mm/px · 1 of 30 slices shown]
[im 1/30]
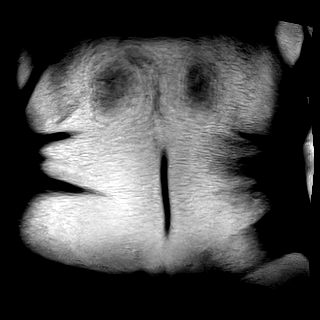

[Series 9: T2 fat-sat · axial · 6.0mm · 1.19mm/px · 1 of 34 slices shown]
[im 1/34]
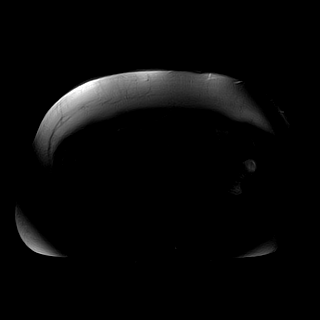

[Series 10: ax dwi_tracew · axial · 6.0mm · 1.42mm/px · z∈[-70,+167]mm · 4 of 102 slices shown]
[im 1/102]
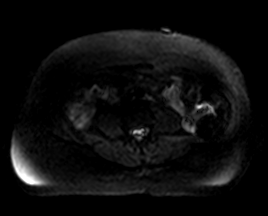
[im 34/102]
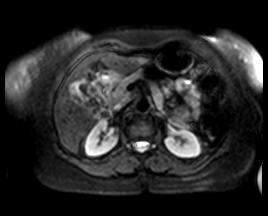
[im 68/102]
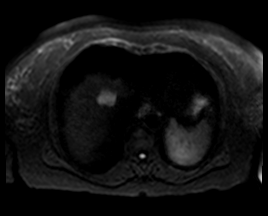
[im 102/102]
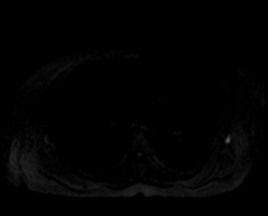

[Series 11: ax dwi_adc · axial · 6.0mm · 1.42mm/px · 1 of 34 slices shown]
[im 1/34]
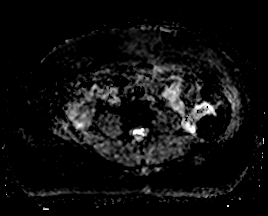

[Series 13: bSSFP · axial · 6.0mm · 0.74mm/px · 1 of 32 slices shown]
[im 1/32]
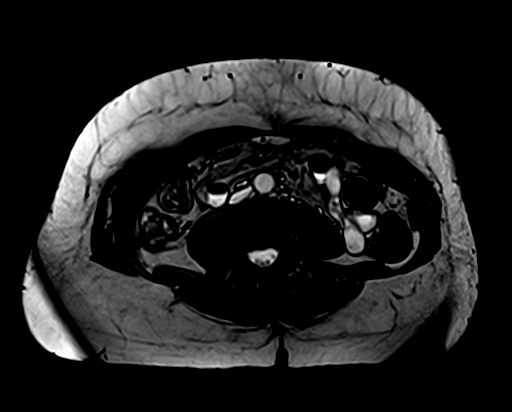

[Series 14: T1 dynamic fat-sat · axial · non-contrast · 3.0mm · 1.19mm/px · z∈[-70,+167]mm · 3 of 80 slices shown (1 of 5)]
[im 1/80]
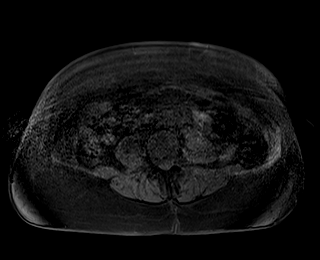
[im 40/80]
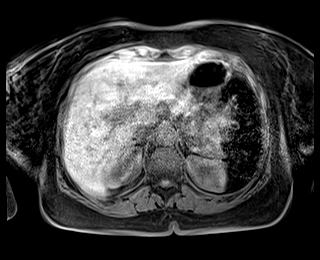
[im 80/80]
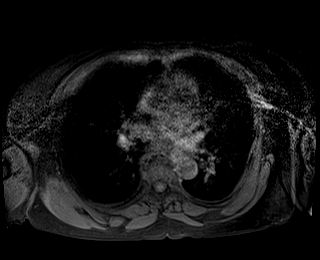

[Series 17: T1 dynamic fat-sat post-contrast · axial · 3.0mm · 1.19mm/px · z∈[-70,+167]mm · 3 of 80 slices shown (1 of 4)]
[im 1/80]
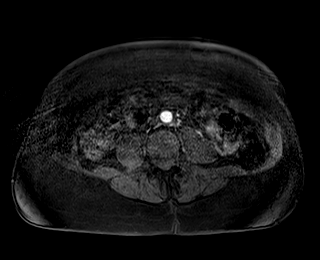
[im 40/80]
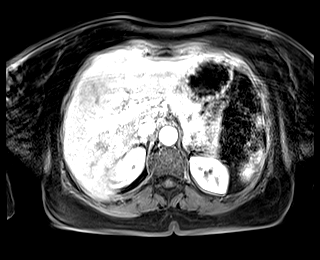
[im 80/80]
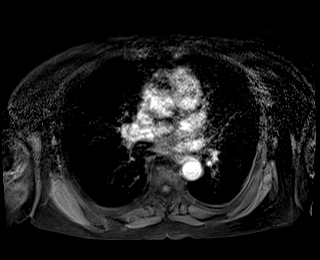

[Series 18: T1 dynamic fat-sat · axial · 3.0mm · 1.19mm/px · z∈[-70,+167]mm · 3 of 80 slices shown (2 of 5)]
[im 1/80]
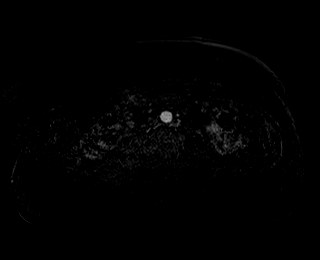
[im 40/80]
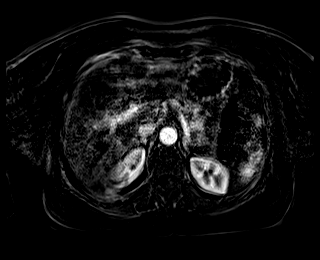
[im 80/80]
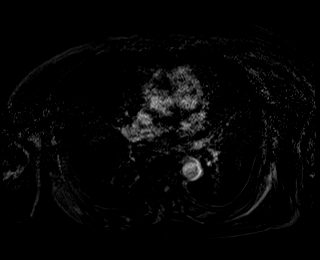

[Series 19: T1 dynamic fat-sat post-contrast · axial · 3.0mm · 1.19mm/px · z∈[-70,+167]mm · 3 of 80 slices shown (2 of 4)]
[im 1/80]
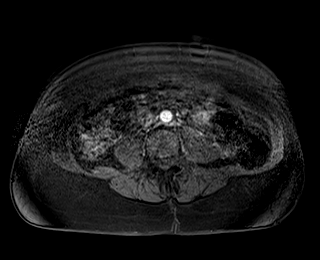
[im 40/80]
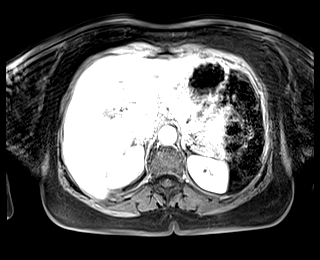
[im 80/80]
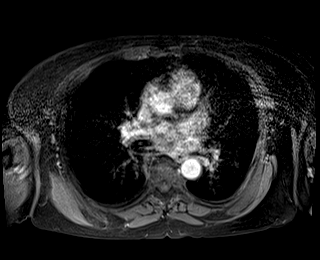

[Series 20: T1 dynamic fat-sat · axial · 3.0mm · 1.19mm/px · z∈[-70,+167]mm · 3 of 80 slices shown (3 of 5)]
[im 1/80]
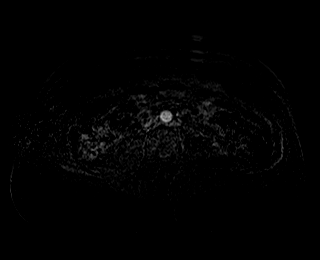
[im 40/80]
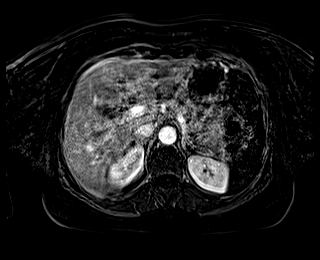
[im 80/80]
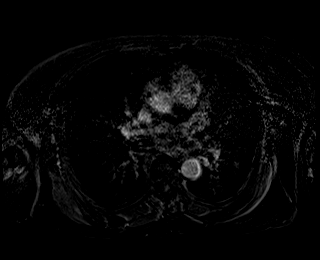

[Series 21: T1 dynamic fat-sat post-contrast · axial · 3.0mm · 1.19mm/px · z∈[-70,+167]mm · 3 of 80 slices shown (3 of 4)]
[im 1/80]
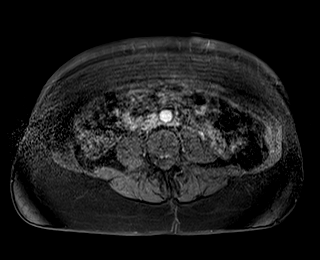
[im 40/80]
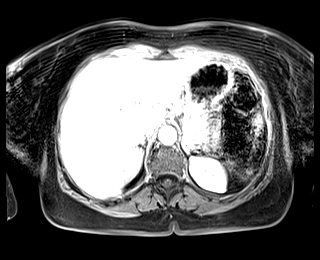
[im 80/80]
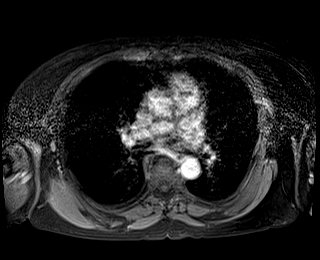

[Series 22: T1 dynamic fat-sat · axial · 3.0mm · 1.19mm/px · z∈[-70,+167]mm · 3 of 80 slices shown (4 of 5)]
[im 1/80]
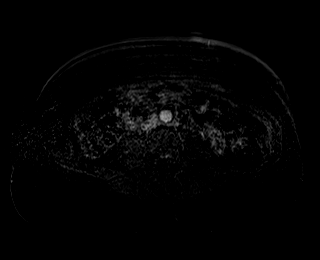
[im 40/80]
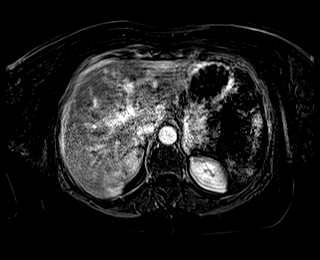
[im 80/80]
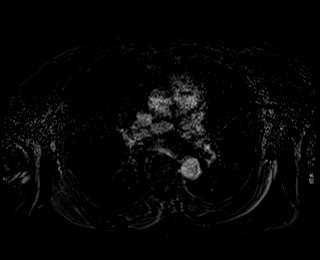

[Series 23: T1 dynamic post-contrast · coronal · 3.0mm · 1.31mm/px · 3 of 72 slices shown]
[im 1/72]
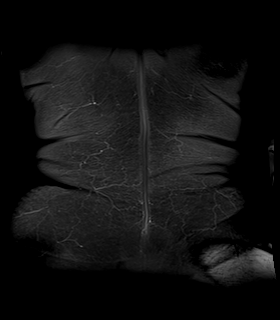
[im 36/72]
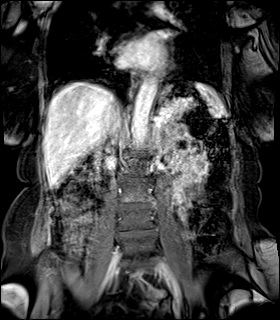
[im 72/72]
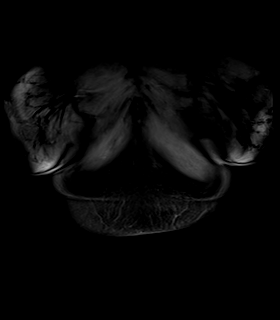

[Series 24: T1 dynamic fat-sat post-contrast · axial · 3.0mm · 1.19mm/px · z∈[-70,+167]mm · 3 of 80 slices shown (4 of 4)]
[im 1/80]
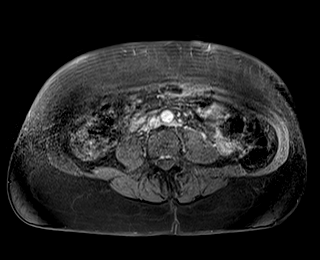
[im 40/80]
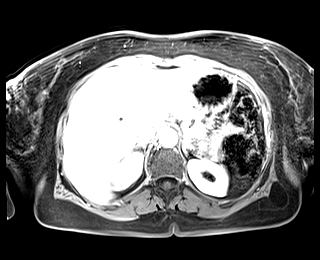
[im 80/80]
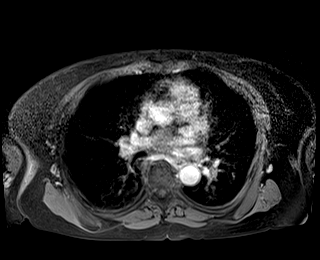

[Series 25: T1 dynamic fat-sat · axial · 3.0mm · 1.19mm/px · z∈[-70,+167]mm · 3 of 80 slices shown (5 of 5)]
[im 1/80]
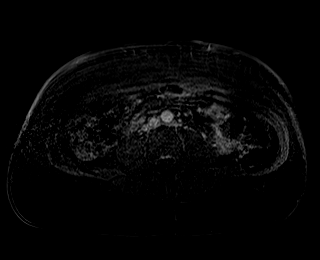
[im 40/80]
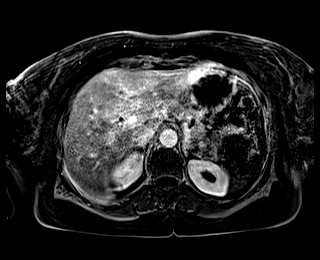
[im 80/80]
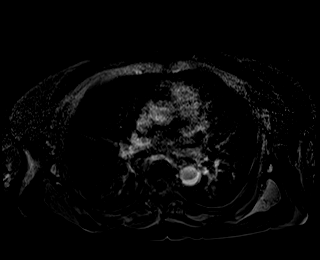

[Series 1028: h-f · axial · 0.6mm · 0.43mm/px · 1 of 19 slices shown (1 of 2)]
[im 1/19]
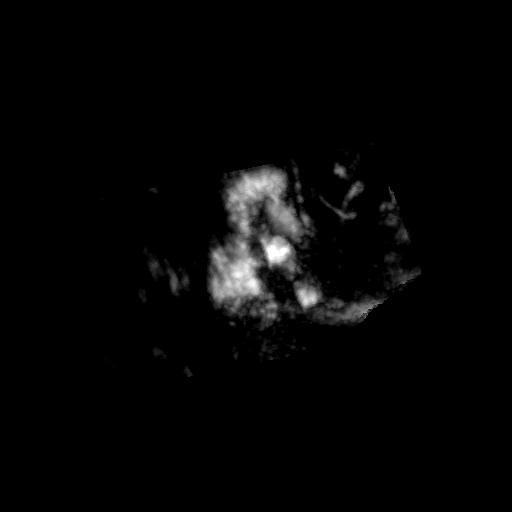

[Series 1042: h-f · axial · 0.5mm · 0.43mm/px · 1 of 19 slices shown (2 of 2)]
[im 1/19]
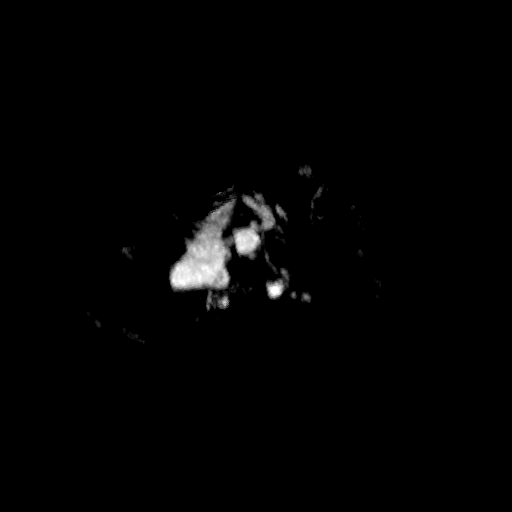

[40 of 48 positions shown; findings below may reference images not displayed]

FINDINGS: Examination limited by respiratory motion despite coaching the
patient.

Lower chest: The lung bases are clear of an acute process. No
pulmonary lesions or pleural effusion. No pericardial effusion.

Hepatobiliary: 3 cm lesion at the left hepatic dome along with the 3
cm lesion in segment 3. Both of these are somewhat lobulated and
demonstrate high T2 signal intensity. There is also a 7 mm T2
hyperintense lesion and segment 5. Although the postcontrast images
are quite limited both of the larger lesions appear to demonstrate
some degree of peripheral nodular enhancement. The smaller lesion
shows complete filling in on the delayed images and all 3 of these
lesions are likely benign hepatic hemangiomas.

The gallbladder is surgically absent. No common bile duct dilatation
or common bile duct stones.

Pancreas: 10 mm cystic lesion is noted in the pancreatic head. I do
not see any obvious enhancement, septations or nodularity. This is
likely a benign postinflammatory cyst. This patient is not a good
candidate for MRI follow-up. Lesion is fairly well seen on
ultrasound and may be reasonable to obtain a follow-up ultrasound
examination in 6 months to re-evaluate. The remainder of the
pancreas is unremarkable. No acute inflammation or ductal
dilatation.

Spleen:  Normal size.  No focal lesions.

Adrenals/Urinary Tract: The adrenal glands and kidneys are
unremarkable. No worrisome renal lesions or hydronephrosis.

Stomach/Bowel: The stomach, duodenum, visualized small bowel and
visualized colon are grossly normal.

Vascular/Lymphatic: The aorta is normal in caliber. The branch
vessels are patent. No mesenteric or retroperitoneal mass or
adenopathy.

Other:  No ascites or abdominal wall hernia.

Musculoskeletal: No significant bony findings.
IMPRESSION: 1. Examination limited by respiratory motion despite coaching the
patient.
2. 10 mm cystic lesion in the pancreatic head is likely a benign
postinflammatory cyst. This patient is not a good candidate for MRI
follow-up. Lesion is fairly well seen on ultrasound and it may be
reasonable to obtain a follow-up ultrasound examination in 6 months
to re-evaluate. Hepatic lesions consistent with hemangiomas. These
could also be followed up with a right upper quadrant ultrasound
exam.
3. No acute abdominal findings or adenopathy.
4. Status post cholecystectomy.  No biliary dilatation.

## 2022-10-05 ENCOUNTER — Other Ambulatory Visit: Payer: Self-pay | Admitting: Family Medicine

## 2022-10-05 DIAGNOSIS — Z1231 Encounter for screening mammogram for malignant neoplasm of breast: Secondary | ICD-10-CM

## 2022-10-21 ENCOUNTER — Ambulatory Visit
Admission: RE | Admit: 2022-10-21 | Discharge: 2022-10-21 | Disposition: A | Discharge status: Home / Self Care | Payer: Medicare Other | Source: Ambulatory Visit | Attending: Family Medicine | Admitting: Family Medicine

## 2022-10-21 DIAGNOSIS — Z1231 Encounter for screening mammogram for malignant neoplasm of breast: Secondary | ICD-10-CM | POA: Diagnosis present

## 2023-04-14 ENCOUNTER — Encounter: Payer: Self-pay | Admitting: Anesthesiology

## 2023-04-14 ENCOUNTER — Encounter: Payer: Self-pay | Admitting: Ophthalmology

## 2023-04-27 ENCOUNTER — Ambulatory Visit: Admission: RE | Admit: 2023-04-27 | Payer: Medicare Other | Source: Home / Self Care | Admitting: Ophthalmology

## 2023-04-27 HISTORY — DX: Presence of dental prosthetic device (complete) (partial): Z97.2

## 2023-04-27 SURGERY — CATARACT EXTRACTION PHACO AND INTRAOCULAR LENS PLACEMENT (IOC)
Anesthesia: Topical | Laterality: Right

## 2023-05-11 ENCOUNTER — Ambulatory Visit: Admit: 2023-05-11 | Payer: Medicare Other | Admitting: Ophthalmology

## 2023-05-11 SURGERY — CATARACT EXTRACTION PHACO AND INTRAOCULAR LENS PLACEMENT (IOC)
Anesthesia: Topical | Laterality: Left

## 2023-08-18 ENCOUNTER — Encounter: Payer: Self-pay | Admitting: Ophthalmology

## 2023-08-18 NOTE — Anesthesia Preprocedure Evaluation (Addendum)
 Anesthesia Evaluation  Patient identified by MRN, date of birth, ID band Patient awake    Reviewed: Allergy & Precautions, H&P , NPO status , Patient's Chart, lab work & pertinent test results  Airway Mallampati: III  TM Distance: >3 FB Neck ROM: Full    Dental no notable dental hx. (+) Missing, Poor Dentition, Chipped, Loose  Poor Dentition, Chipped, Missing, has two remaining teeth, loose:   Pulmonary neg pulmonary ROS   Pulmonary exam normal breath sounds clear to auscultation       Cardiovascular hypertension, negative cardio ROS Normal cardiovascular exam Rhythm:Regular Rate:Normal     Neuro/Psych Seizures -,  PSYCHIATRIC DISORDERS Anxiety   Schizophrenia Dementia negative neurological ROS  negative psych ROS   GI/Hepatic negative GI ROS, Neg liver ROS,,,  Endo/Other  negative endocrine ROSdiabetes    Renal/GU Renal diseasenegative Renal ROS  negative genitourinary   Musculoskeletal negative musculoskeletal ROS (+) Arthritis ,    Abdominal   Peds negative pediatric ROS (+)  Hematology negative hematology ROS (+)   Anesthesia Other Findings  Seizures   Diabetes mellitus without complication  Arthritis  Schizophrenia  Wears dentures  Tardive dyskinesia 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    Reproductive/Obstetrics negative OB ROS                             Anesthesia Physical Anesthesia Plan  ASA: 3  Anesthesia Plan: MAC   Post-op Pain Management:    Induction: Intravenous  PONV Risk Score and Plan:   Airway Management Planned: Natural Airway and Nasal Cannula  Additional Equipment:   Intra-op Plan:   Post-operative Plan:   Informed Consent: I have reviewed the patients History and Physical, chart, labs and discussed the procedure including the risks, benefits and alternatives for the proposed anesthesia with the patient or authorized  representative who has indicated his/her understanding and acceptance.     Dental Advisory Given  Plan Discussed with: Anesthesiologist, CRNA and Surgeon  Anesthesia Plan Comments: (Patient consented for risks of anesthesia including but not limited to:  - adverse reactions to medications - damage to eyes, teeth, lips or other oral mucosa - nerve damage due to positioning  - sore throat or hoarseness - Damage to heart, brain, nerves, lungs, other parts of body or loss of life  Patient voiced understanding and assent.)        Anesthesia Quick Evaluation

## 2023-08-20 NOTE — Discharge Instructions (Signed)

## 2023-08-24 ENCOUNTER — Other Ambulatory Visit: Payer: Self-pay

## 2023-08-24 ENCOUNTER — Ambulatory Visit: Payer: Self-pay | Admitting: Anesthesiology

## 2023-08-24 ENCOUNTER — Encounter: Payer: Self-pay | Admitting: Ophthalmology

## 2023-08-24 ENCOUNTER — Ambulatory Visit
Admission: RE | Admit: 2023-08-24 | Discharge: 2023-08-24 | Disposition: A | Discharge status: Home / Self Care | Attending: Ophthalmology | Admitting: Ophthalmology

## 2023-08-24 ENCOUNTER — Encounter
Admission: RE | Disposition: A | Discharge status: Home / Self Care | Payer: Self-pay | Source: Home / Self Care | Attending: Ophthalmology

## 2023-08-24 DIAGNOSIS — E1122 Type 2 diabetes mellitus with diabetic chronic kidney disease: Secondary | ICD-10-CM | POA: Diagnosis not present

## 2023-08-24 DIAGNOSIS — F419 Anxiety disorder, unspecified: Secondary | ICD-10-CM | POA: Diagnosis not present

## 2023-08-24 DIAGNOSIS — I129 Hypertensive chronic kidney disease with stage 1 through stage 4 chronic kidney disease, or unspecified chronic kidney disease: Secondary | ICD-10-CM | POA: Insufficient documentation

## 2023-08-24 DIAGNOSIS — F209 Schizophrenia, unspecified: Secondary | ICD-10-CM | POA: Diagnosis not present

## 2023-08-24 DIAGNOSIS — H2511 Age-related nuclear cataract, right eye: Secondary | ICD-10-CM | POA: Diagnosis not present

## 2023-08-24 DIAGNOSIS — E1136 Type 2 diabetes mellitus with diabetic cataract: Secondary | ICD-10-CM | POA: Diagnosis present

## 2023-08-24 DIAGNOSIS — H25011 Cortical age-related cataract, right eye: Secondary | ICD-10-CM | POA: Diagnosis present

## 2023-08-24 DIAGNOSIS — N183 Chronic kidney disease, stage 3 unspecified: Secondary | ICD-10-CM | POA: Diagnosis not present

## 2023-08-24 DIAGNOSIS — F02B0 Dementia in other diseases classified elsewhere, moderate, without behavioral disturbance, psychotic disturbance, mood disturbance, and anxiety: Secondary | ICD-10-CM | POA: Insufficient documentation

## 2023-08-24 HISTORY — DX: Dysphasia: R47.02

## 2023-08-24 HISTORY — PX: CATARACT EXTRACTION W/PHACO: SHX586

## 2023-08-24 HISTORY — DX: Generalized anxiety disorder: F41.1

## 2023-08-24 HISTORY — DX: Drug induced subacute dyskinesia: G24.01

## 2023-08-24 HISTORY — DX: Type 2 diabetes mellitus with diabetic chronic kidney disease: E11.22

## 2023-08-24 HISTORY — DX: Alzheimer's disease, unspecified: G30.9

## 2023-08-24 SURGERY — PHACOEMULSIFICATION, CATARACT, WITH IOL INSERTION
Anesthesia: Monitor Anesthesia Care | Site: Eye | Laterality: Right

## 2023-08-24 MED ORDER — FENTANYL CITRATE (PF) 100 MCG/2ML IJ SOLN
INTRAMUSCULAR | Status: DC | PRN
  Administered 2023-08-24 (×2): 25 ug via INTRAVENOUS

## 2023-08-24 MED ORDER — ARMC OPHTHALMIC DILATING DROPS
OPHTHALMIC | Status: AC
  Filled 2023-08-24: qty 0.5

## 2023-08-24 MED ORDER — FENTANYL CITRATE (PF) 100 MCG/2ML IJ SOLN
INTRAMUSCULAR | Status: AC
  Filled 2023-08-24: qty 2

## 2023-08-24 MED ORDER — SIGHTPATH DOSE#1 BSS IO SOLN
INTRAOCULAR | Status: DC | PRN
  Administered 2023-08-24: 74 mL via OPHTHALMIC

## 2023-08-24 MED ORDER — LIDOCAINE HCL (PF) 2 % IJ SOLN
INTRAMUSCULAR | Status: DC | PRN
  Administered 2023-08-24: 2 mL

## 2023-08-24 MED ORDER — LACTATED RINGERS IV SOLN: INTRAVENOUS | Status: DC

## 2023-08-24 MED ORDER — ARMC OPHTHALMIC DILATING DROPS
1.0000 | OPHTHALMIC | Status: AC
  Administered 2023-08-24 (×3): 1 via OPHTHALMIC

## 2023-08-24 MED ORDER — TRYPAN BLUE 0.06 % IO SOSY
PREFILLED_SYRINGE | INTRAOCULAR | Status: DC | PRN
  Administered 2023-08-24: .5 mL via INTRAOCULAR

## 2023-08-24 MED ORDER — BRIMONIDINE TARTRATE-TIMOLOL 0.2-0.5 % OP SOLN
OPHTHALMIC | Status: DC | PRN
  Administered 2023-08-24: 1 [drp] via OPHTHALMIC

## 2023-08-24 MED ORDER — SIGHTPATH DOSE#1 BSS IO SOLN
INTRAOCULAR | Status: DC | PRN
  Administered 2023-08-24: 15 mL via INTRAOCULAR

## 2023-08-24 MED ORDER — TETRACAINE HCL 0.5 % OP SOLN
1.0000 [drp] | OPHTHALMIC | Status: AC
  Administered 2023-08-24 (×3): 1 [drp] via OPHTHALMIC

## 2023-08-24 MED ORDER — SIGHTPATH DOSE#1 NA CHONDROIT SULF-NA HYALURON 40-17 MG/ML IO SOLN
INTRAOCULAR | Status: DC | PRN
  Administered 2023-08-24: 1 mL via INTRAOCULAR

## 2023-08-24 MED ORDER — MIDAZOLAM HCL 2 MG/2ML IJ SOLN
INTRAMUSCULAR | Status: AC
  Filled 2023-08-24: qty 2

## 2023-08-24 MED ORDER — TETRACAINE HCL 0.5 % OP SOLN
OPHTHALMIC | Status: AC
  Filled 2023-08-24: qty 4

## 2023-08-24 MED ORDER — MOXIFLOXACIN HCL 0.5 % OP SOLN
OPHTHALMIC | Status: DC | PRN
  Administered 2023-08-24: .2 mL via OPHTHALMIC

## 2023-08-24 SURGICAL SUPPLY — 12 items
CATARACT SUITE SIGHTPATH (MISCELLANEOUS) ×1 IMPLANT
CYSTOTOME ANGL RVRS SHRT 25G (CUTTER) ×1 IMPLANT
CYSTOTOME ANGL RVRS SHRT 25GA (CUTTER) ×1 IMPLANT
FEE CATARACT SUITE SIGHTPATH (MISCELLANEOUS) ×1 IMPLANT
GLOVE BIOGEL PI IND STRL 8 (GLOVE) ×1 IMPLANT
GLOVE SURG LX STRL 8.0 MICRO (GLOVE) ×1 IMPLANT
GLOVE SURG PROTEXIS BL SZ6.5 (GLOVE) ×1 IMPLANT
GLOVE SURG SYN 6.5 PF PI BL (GLOVE) ×1 IMPLANT
LENS IOL TECNIS EYHANCE 10.0 (Intraocular Lens) IMPLANT
NDL FILTER BLUNT 18X1 1/2 (NEEDLE) ×1 IMPLANT
NEEDLE FILTER BLUNT 18X1 1/2 (NEEDLE) ×1 IMPLANT
SYR 3ML LL SCALE MARK (SYRINGE) ×1 IMPLANT

## 2023-08-24 NOTE — Op Note (Signed)
 PREOPERATIVE DIAGNOSIS:  Nuclear sclerotic cataract of the right eye.   POSTOPERATIVE DIAGNOSIS:  Right Eye Cataract   OPERATIVE PROCEDURE:ORPROCALL@   SURGEON:  Clair Crews, MD.   ANESTHESIA:  Anesthesiologist: Emilie Harden, MD CRNA: Sherrlyn Dolores, CRNA  1.      Managed anesthesia care. 2.      0.64ml of Shugarcaine was instilled in the eye following the paracentesis.   COMPLICATIONS: Vision Blue was used to stain the anterior capsule due to very poor/ no visualization of the red reflex.    TECHNIQUE:   Stop and chop   DESCRIPTION OF PROCEDURE:  The patient was examined and consented in the preoperative holding area where the aforementioned topical anesthesia was applied to the right eye and then brought back to the Operating Room where the right eye was prepped and draped in the usual sterile ophthalmic fashion and a lid speculum was placed. A paracentesis was created with the side port blade and the anterior chamber was filled with viscoelastic. A near clear corneal incision was performed with the steel keratome. A continuous curvilinear capsulorrhexis was performed with a cystotome followed by the capsulorrhexis forceps. Hydrodissection and hydrodelineation were carried out with BSS on a blunt cannula. The lens was removed in a stop and chop  technique and the remaining cortical material was removed with the irrigation-aspiration handpiece. The capsular bag was inflated with viscoelastic and the Technis ZCB00  lens was placed in the capsular bag without complication. The remaining viscoelastic was removed from the eye with the irrigation-aspiration handpiece. The wounds were hydrated. The anterior chamber was flushed with BSS and the eye was inflated to physiologic pressure. 0.1ml of Vigamox was placed in the anterior chamber. The wounds were found to be water tight. The eye was dressed with Combigan. The patient was given protective glasses to wear throughout the day and a shield  with which to sleep tonight. The patient was also given drops with which to begin a drop regimen today and will follow-up with me in one day. Implant Name Type Inv. Item Serial No. Manufacturer Lot No. LRB No. Used Action  LENS IOL TECNIS EYHANCE 10.0 - [id number] Intraocular Lens LENS IOL TECNIS EYHANCE 10.0 [id number] [employer]  Right 1 Implanted   Procedure(s): PHACOEMULSIFICATION, CATARACT, WITH IOL INSERTION 10.28 00:59.1 (Right)  Electronically signed: Clair Crews 08/24/2023 7:59 AM

## 2023-08-24 NOTE — Transfer of Care (Signed)
 Immediate Anesthesia Transfer of Care Note  Patient: Tina Holden  Procedure(s) Performed: PHACOEMULSIFICATION, CATARACT, WITH IOL INSERTION 10.28 00:59.1 (Right: Eye)  Patient Location: PACU  Anesthesia Type: MAC  Level of Consciousness: awake, alert  and patient cooperative  Airway and Oxygen Therapy: Patient Spontanous Breathing and Patient connected to supplemental oxygen  Post-op Assessment: Post-op Vital signs reviewed, Patient's Cardiovascular Status Stable, Respiratory Function Stable, Patent Airway and No signs of Nausea or vomiting  Post-op Vital Signs: Reviewed and stable  Complications: No notable events documented.

## 2023-08-24 NOTE — Anesthesia Postprocedure Evaluation (Signed)
 Anesthesia Post Note  Patient: Tina Holden  Procedure(s) Performed: PHACOEMULSIFICATION, CATARACT, WITH IOL INSERTION 10.28 00:59.1 (Right: Eye)  Patient location during evaluation: PACU Anesthesia Type: MAC Level of consciousness: awake and alert Pain management: pain level controlled Vital Signs Assessment: post-procedure vital signs reviewed and stable Respiratory status: spontaneous breathing, nonlabored ventilation, respiratory function stable and patient connected to nasal cannula oxygen Cardiovascular status: stable and blood pressure returned to baseline Postop Assessment: no apparent nausea or vomiting Anesthetic complications: no   No notable events documented.   Last Vitals:  Vitals:   08/24/23 0759 08/24/23 0803  BP: (!) 158/80 (!) 178/78  Pulse: (!) 55 (!) 55  Resp: 14 20  Temp: 36.4 C 36.4 C  SpO2: 92% 97%    Last Pain:  Vitals:   08/24/23 0803  TempSrc:   PainSc: 0-No pain                 Anahlia Iseminger C Ivone Licht

## 2023-08-24 NOTE — H&P (Signed)
 [facility]   Primary Care Physician:  Nestor Banter, MD Ophthalmologist: Dr. Merrell Abate  Pre-Procedure History & Physical: HPI:  Tina Holden is a 73 y.o. female here for cataract surgery.   Past Medical History:  Diagnosis Date   Arthritis    CKD stage 3 due to type 2 diabetes mellitus (HCC)    Generalized anxiety disorder    Moderate Alzheimer's dementia (HCC)    Paraphasia    Schizophrenia (HCC)    Seizures (HCC)    Tardive dyskinesia    Wears dentures    full upper and lower    Past Surgical History:  Procedure Laterality Date   ABDOMINAL HYSTERECTOMY     BREAST BIOPSY Left 2013,2015   neg- core   BREAST BIOPSY Left 09/02/2017   affirm stereo biopsy/ path pending   ENDOSCOPIC RETROGRADE CHOLANGIOPANCREATOGRAPHY (ERCP) WITH PROPOFOL  N/A 06/15/2020   Procedure: ENDOSCOPIC RETROGRADE CHOLANGIOPANCREATOGRAPHY (ERCP) WITH PROPOFOL ;  Surgeon: Marnee Sink, MD;  Location: [facility] ENDOSCOPY;  Service: Endoscopy;  Laterality: N/A;    Prior to Admission medications   Medication Sig Start Date End Date Taking? Authorizing Provider  Acetaminophen  325 MG CAPS Take 325-650 mg by mouth as needed for mild pain.   Yes [provider]  benzonatate (TESSALON) 100 MG capsule Take 100 mg by mouth 3 (three) times daily as needed for cough.   Yes [provider]  busPIRone (BUSPAR) 5 MG tablet Take 5 mg by mouth daily.   Yes [provider]  calcium-vitamin D (OSCAL WITH D) 500-200 MG-UNIT per tablet Take 1 tablet by mouth 2 (two) times daily.   Yes [provider]  Cholecalciferol 5000 UNITS capsule Take 5,000 Units by mouth daily.   Yes [provider]  donepezil  (ARICEPT ) 10 MG tablet Take 10 mg by mouth daily. 05/14/20  Yes [provider]  famotidine  (PEPCID ) 20 MG tablet Take 20 mg by mouth daily. 05/14/20  Yes [provider]  fluticasone  (FLONASE ) 50 MCG/ACT nasal spray Place 2 sprays into both nostrils daily.  12/21/19  Yes [provider]  gabapentin  (NEURONTIN ) 300 MG capsule Take 300 mg by mouth 2 (two) times daily.   Yes [provider]  lamoTRIgine  (LAMICTAL ) 150 MG tablet Take 150 mg by mouth 2 (two) times daily. 05/14/20  Yes [provider]  loperamide (IMODIUM A-D) 2 MG tablet Take 2 mg by mouth 4 (four) times daily as needed for diarrhea or loose stools.   Yes [provider]  memantine (NAMENDA) 10 MG tablet Take 10 mg by mouth 2 (two) times daily.   Yes [provider]  polyethylene glycol (MIRALAX  / GLYCOLAX ) packet Take 17 g by mouth daily as needed for mild constipation.   Yes [provider]  QUEtiapine  (SEROQUEL  XR) 50 MG TB24 24 hr tablet Take 25 mg by mouth at bedtime. 05/14/20  Yes [provider]  traZODone  (DESYREL ) 100 MG tablet Take 50 mg by mouth at bedtime as needed for sleep.   Yes [provider]  triamcinolone cream (KENALOG) 0.1 % Apply 1 application topically daily as needed.   Yes [provider]  vitamin B-12 (CYANOCOBALAMIN) 1000 MCG tablet Take 1,000 mcg by mouth daily.   Yes [provider]  nystatin (MYCOSTATIN/NYSTOP) 100000 UNIT/GM POWD Apply 1 Bottle topically 2 (two) times daily as needed. Apply a small amount    [provider]    Allergies as of 08/17/2023 - Review Complete 04/14/2023  Allergen Reaction Noted   Chocolate  04/14/2023   Dilantin [phenytoin sodium extended]  12/11/2014   Asa [aspirin] Swelling and Rash 12/11/2014   Butalbital Rash 04/14/2023   Codeine Rash 12/11/2014    Family History  Problem Relation Age of Onset   Breast cancer Maternal Aunt    Breast cancer Maternal Grandmother     Social History   Socioeconomic History   Marital status: Single    Spouse name: Not on file   Number of children: Not on file   Years of education: Not on file   Highest education level: Not on file  Occupational History   Not on file  Tobacco Use    Smoking status: Never   Smokeless tobacco: Never  Vaping Use   Vaping status: Never Used  Substance and Sexual Activity   Alcohol use: No   Drug use: No   Sexual activity: Yes    Birth control/protection: None, Surgical  Other Topics Concern   Not on file  Social History Narrative   Not on file   Social Drivers of Health   Financial Resource Strain: Low Risk  (03/01/2023)   Received from [facility]   Overall Financial Resource Strain (CARDIA)    Difficulty of Paying Living Expenses: Not hard at all  Food Insecurity: No Food Insecurity (03/01/2023)   Received from [facility]   Hunger Vital Sign    Ran Out of Food in the Last Year: Never true    Worried About Running Out of Food in the Last Year: Never true  Transportation Needs: No Transportation Needs (03/01/2023)   Received from [facility]   PRAPARE - Transportation    Lack of Transportation (Non-Medical): No    In the past 12 months, has lack of transportation kept you from medical appointments or from getting medications?: No  Physical Activity: Not on file  Stress: Not on file  Social Connections: Not on file  Intimate Partner Violence: Not on file    Review of Systems: See HPI, otherwise negative ROS  Physical Exam: BP (!) 159/79   Pulse (!) 48   Temp 97.8 F (36.6 C) (Temporal)   Resp 10   Ht 5\' 6"  (1.676 m)   Wt 76.7 kg   SpO2 97%   BMI 27.28 kg/m  General:   Alert, cooperative. Head:  Normocephalic and atraumatic. Respiratory:  Normal work of breathing. Cardiovascular:  NAD  Impression/Plan: Tina Holden is here for cataract surgery.  Risks, benefits, limitations, and alternatives regarding cataract surgery have been reviewed with the patient.  Questions have been answered.  All parties agreeable.   Clair Crews, MD  08/24/2023, 7:22 AM

## 2023-08-27 ENCOUNTER — Encounter: Payer: Self-pay | Admitting: Ophthalmology

## 2023-09-03 NOTE — Discharge Instructions (Signed)

## 2023-09-07 ENCOUNTER — Encounter: Payer: Self-pay | Admitting: Ophthalmology

## 2023-09-07 ENCOUNTER — Ambulatory Visit: Payer: Self-pay | Admitting: Anesthesiology

## 2023-09-07 ENCOUNTER — Encounter
Admission: RE | Disposition: A | Discharge status: Home / Self Care | Payer: Self-pay | Source: Home / Self Care | Attending: Ophthalmology

## 2023-09-07 ENCOUNTER — Other Ambulatory Visit: Payer: Self-pay

## 2023-09-07 ENCOUNTER — Ambulatory Visit
Admission: RE | Admit: 2023-09-07 | Discharge: 2023-09-07 | Disposition: A | Discharge status: Home / Self Care | Attending: Ophthalmology | Admitting: Ophthalmology

## 2023-09-07 DIAGNOSIS — Z79899 Other long term (current) drug therapy: Secondary | ICD-10-CM | POA: Insufficient documentation

## 2023-09-07 DIAGNOSIS — H2512 Age-related nuclear cataract, left eye: Secondary | ICD-10-CM | POA: Insufficient documentation

## 2023-09-07 DIAGNOSIS — M1990 Unspecified osteoarthritis, unspecified site: Secondary | ICD-10-CM | POA: Insufficient documentation

## 2023-09-07 DIAGNOSIS — I129 Hypertensive chronic kidney disease with stage 1 through stage 4 chronic kidney disease, or unspecified chronic kidney disease: Secondary | ICD-10-CM | POA: Diagnosis not present

## 2023-09-07 DIAGNOSIS — R569 Unspecified convulsions: Secondary | ICD-10-CM | POA: Insufficient documentation

## 2023-09-07 DIAGNOSIS — E1136 Type 2 diabetes mellitus with diabetic cataract: Secondary | ICD-10-CM | POA: Insufficient documentation

## 2023-09-07 DIAGNOSIS — F02B0 Dementia in other diseases classified elsewhere, moderate, without behavioral disturbance, psychotic disturbance, mood disturbance, and anxiety: Secondary | ICD-10-CM | POA: Insufficient documentation

## 2023-09-07 DIAGNOSIS — G309 Alzheimer's disease, unspecified: Secondary | ICD-10-CM | POA: Diagnosis not present

## 2023-09-07 DIAGNOSIS — N183 Chronic kidney disease, stage 3 unspecified: Secondary | ICD-10-CM | POA: Diagnosis not present

## 2023-09-07 DIAGNOSIS — E1122 Type 2 diabetes mellitus with diabetic chronic kidney disease: Secondary | ICD-10-CM | POA: Diagnosis not present

## 2023-09-07 DIAGNOSIS — H25012 Cortical age-related cataract, left eye: Secondary | ICD-10-CM | POA: Diagnosis present

## 2023-09-07 HISTORY — PX: CATARACT EXTRACTION W/PHACO: SHX586

## 2023-09-07 SURGERY — PHACOEMULSIFICATION, CATARACT, WITH IOL INSERTION
Anesthesia: Monitor Anesthesia Care | Site: Eye | Laterality: Left

## 2023-09-07 MED ORDER — TETRACAINE HCL 0.5 % OP SOLN
OPHTHALMIC | Status: AC
  Filled 2023-09-07: qty 4

## 2023-09-07 MED ORDER — FENTANYL CITRATE (PF) 100 MCG/2ML IJ SOLN
INTRAMUSCULAR | Status: AC
Start: 2023-09-07 — End: 2023-09-07
  Filled 2023-09-07: qty 2

## 2023-09-07 MED ORDER — BRIMONIDINE TARTRATE-TIMOLOL 0.2-0.5 % OP SOLN
OPHTHALMIC | Status: DC | PRN
  Administered 2023-09-07: 1 [drp] via OPHTHALMIC

## 2023-09-07 MED ORDER — SIGHTPATH DOSE#1 NA CHONDROIT SULF-NA HYALURON 40-17 MG/ML IO SOLN
INTRAOCULAR | Status: DC | PRN
  Administered 2023-09-07: 1 mL via INTRAOCULAR

## 2023-09-07 MED ORDER — SIGHTPATH DOSE#1 BSS IO SOLN
INTRAOCULAR | Status: DC | PRN
  Administered 2023-09-07: 71 mL via OPHTHALMIC

## 2023-09-07 MED ORDER — TETRACAINE HCL 0.5 % OP SOLN
1.0000 [drp] | OPHTHALMIC | Status: AC | PRN
  Administered 2023-09-07 (×3): 1 [drp] via OPHTHALMIC

## 2023-09-07 MED ORDER — ARMC OPHTHALMIC DILATING DROPS
1.0000 | OPHTHALMIC | Status: AC | PRN
Start: 2023-09-07 — End: 2023-09-07
  Administered 2023-09-07 (×3): 1 via OPHTHALMIC

## 2023-09-07 MED ORDER — LIDOCAINE HCL (PF) 2 % IJ SOLN
INTRAOCULAR | Status: DC | PRN
  Administered 2023-09-07: 2 mL

## 2023-09-07 MED ORDER — FENTANYL CITRATE (PF) 100 MCG/2ML IJ SOLN
INTRAMUSCULAR | Status: DC | PRN
  Administered 2023-09-07: 50 ug via INTRAVENOUS
  Administered 2023-09-07 (×2): 25 ug via INTRAVENOUS

## 2023-09-07 MED ORDER — ARMC OPHTHALMIC DILATING DROPS
OPHTHALMIC | Status: AC
  Filled 2023-09-07: qty 0.5

## 2023-09-07 MED ORDER — MOXIFLOXACIN HCL 0.5 % OP SOLN
OPHTHALMIC | Status: DC | PRN
  Administered 2023-09-07: .2 mL via OPHTHALMIC

## 2023-09-07 MED ORDER — SIGHTPATH DOSE#1 BSS IO SOLN
INTRAOCULAR | Status: DC | PRN
  Administered 2023-09-07: 15 mL via INTRAOCULAR

## 2023-09-07 SURGICAL SUPPLY — 12 items
CATARACT SUITE SIGHTPATH (MISCELLANEOUS) ×1 IMPLANT
CYSTOTOME ANGL RVRS SHRT 25G (CUTTER) ×1 IMPLANT
CYSTOTOME ANGL RVRS SHRT 25GA (CUTTER) ×1 IMPLANT
FEE CATARACT SUITE SIGHTPATH (MISCELLANEOUS) ×1 IMPLANT
GLOVE BIOGEL PI IND STRL 8 (GLOVE) ×1 IMPLANT
GLOVE SURG LX STRL 8.0 MICRO (GLOVE) ×1 IMPLANT
GLOVE SURG PROTEXIS BL SZ6.5 (GLOVE) ×1 IMPLANT
GLOVE SURG SYN 6.5 PF PI BL (GLOVE) ×1 IMPLANT
LENS IOL TECNIS EYHANCE 14.0 (Intraocular Lens) IMPLANT
NDL FILTER BLUNT 18X1 1/2 (NEEDLE) ×1 IMPLANT
NEEDLE FILTER BLUNT 18X1 1/2 (NEEDLE) ×1 IMPLANT
SYR 3ML LL SCALE MARK (SYRINGE) ×1 IMPLANT

## 2023-09-07 NOTE — Op Note (Signed)
 PREOPERATIVE DIAGNOSIS:  Nuclear sclerotic cataract of the left eye.   POSTOPERATIVE DIAGNOSIS:  Nuclear sclerotic cataract of the left eye.   OPERATIVE PROCEDURE:ORPROCALL@   SURGEON:  Clair Crews, MD.   ANESTHESIA:  Anesthesiologist: Nancey Awkward, MD CRNA: Wilhelmena Hanson, CRNA  1.      Managed anesthesia care. 2.     0.73ml of Shugarcaine was instilled following the paracentesis   COMPLICATIONS:  None.   TECHNIQUE:   Stop and chop   DESCRIPTION OF PROCEDURE:  The patient was examined and consented in the preoperative holding area where the aforementioned topical anesthesia was applied to the left eye and then brought back to the Operating Room where the left eye was prepped and draped in the usual sterile ophthalmic fashion and a lid speculum was placed. A paracentesis was created with the side port blade and the anterior chamber was filled with viscoelastic. A near clear corneal incision was performed with the steel keratome. A continuous curvilinear capsulorrhexis was performed with a cystotome followed by the capsulorrhexis forceps. Hydrodissection and hydrodelineation were carried out with BSS on a blunt cannula. The lens was removed in a stop and chop  technique and the remaining cortical material was removed with the irrigation-aspiration handpiece. The capsular bag was inflated with viscoelastic and the Technis [id number] lens was placed in the capsular bag without complication. The remaining viscoelastic was removed from the eye with the irrigation-aspiration handpiece. The wounds were hydrated. The anterior chamber was flushed with BSS and the eye was inflated to physiologic pressure. 0.37ml Vigamox  was placed in the anterior chamber. The wounds were found to be water tight. The eye was dressed with Combigan . The patient was given protective glasses to wear throughout the day and a shield with which to sleep tonight. The patient was also given drops with which to begin a drop regimen  today and will follow-up with me in one day. Implant Name Type Inv. Item Serial No. Manufacturer Lot No. LRB No. Used Action  LENS IOL TECNIS EYHANCE 14.0 - [id number] Intraocular Lens LENS IOL TECNIS EYHANCE 14.0 [id number] [employer]  Left 1 Implanted    Procedure(s): PHACOEMULSIFICATION, CATARACT, WITH IOL INSERTION 7.67 00:41.7 (Left)  Electronically signed: Clair Crews 09/07/2023 7:45 AM

## 2023-09-07 NOTE — Anesthesia Preprocedure Evaluation (Signed)
 Anesthesia Evaluation  Patient identified by MRN, date of birth, ID band Patient awake    Reviewed: Allergy & Precautions, H&P , NPO status , Patient's Chart, lab work & pertinent test results  History of Anesthesia Complications Negative for: history of anesthetic complications  Airway Mallampati: III  TM Distance: >3 FB Neck ROM: Full    Dental no notable dental hx. (+) Missing, Poor Dentition, Chipped, Loose  Poor Dentition, Chipped, Missing, has two remaining teeth, loose:   Pulmonary neg pulmonary ROS   Pulmonary exam normal breath sounds clear to auscultation       Cardiovascular hypertension, negative cardio ROS Normal cardiovascular exam Rhythm:Regular Rate:Normal     Neuro/Psych Seizures -,  PSYCHIATRIC DISORDERS Anxiety   Schizophrenia Dementia negative neurological ROS  negative psych ROS   GI/Hepatic negative GI ROS, Neg liver ROS,,,  Endo/Other  negative endocrine ROSdiabetes    Renal/GU Renal diseasenegative Renal ROS  negative genitourinary   Musculoskeletal negative musculoskeletal ROS (+) Arthritis ,    Abdominal   Peds negative pediatric ROS (+)  Hematology negative hematology ROS (+)   Anesthesia Other Findings  Seizures   Diabetes mellitus without complication  Arthritis  Schizophrenia  Wears dentures  Tardive dyskinesia 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    Reproductive/Obstetrics negative OB ROS                              Anesthesia Physical Anesthesia Plan  ASA: 3  Anesthesia Plan: MAC   Post-op Pain Management:    Induction: Intravenous  PONV Risk Score and Plan:   Airway Management Planned: Natural Airway and Nasal Cannula  Additional Equipment:   Intra-op Plan:   Post-operative Plan:   Informed Consent: I have reviewed the patients History and Physical, chart, labs and discussed the procedure including the risks,  benefits and alternatives for the proposed anesthesia with the patient or authorized representative who has indicated his/her understanding and acceptance.     Dental Advisory Given  Plan Discussed with: Anesthesiologist, CRNA and Surgeon  Anesthesia Plan Comments: (Patient consented for risks of anesthesia including but not limited to:  - adverse reactions to medications - damage to eyes, teeth, lips or other oral mucosa - nerve damage due to positioning  - sore throat or hoarseness - Damage to heart, brain, nerves, lungs, other parts of body or loss of life  Patient voiced understanding and assent.)         Anesthesia Quick Evaluation

## 2023-09-07 NOTE — H&P (Signed)
 [facility]   Primary Care Physician:  Nestor Banter, MD Ophthalmologist: Dr. Jeb Miner  Pre-Procedure History & Physical: HPI:  Tina Holden is a 73 y.o. female here for cataract surgery.   Past Medical History:  Diagnosis Date   Arthritis    CKD stage 3 due to type 2 diabetes mellitus (HCC)    Generalized anxiety disorder    Moderate Alzheimer's dementia (HCC)    Paraphasia    Schizophrenia (HCC)    Seizures (HCC)    Tardive dyskinesia    Wears dentures    full upper and lower    Past Surgical History:  Procedure Laterality Date   ABDOMINAL HYSTERECTOMY     BREAST BIOPSY Left 2013,2015   neg- core   BREAST BIOPSY Left 09/02/2017   affirm stereo biopsy/ path pending   CATARACT EXTRACTION W/PHACO Right 08/24/2023   Procedure: PHACOEMULSIFICATION, CATARACT, WITH IOL INSERTION 10.28 00:59.1;  Surgeon: Clair Crews, MD;  Location: [facility];  Service: Ophthalmology;  Laterality: Right;   ENDOSCOPIC RETROGRADE CHOLANGIOPANCREATOGRAPHY (ERCP) WITH PROPOFOL  N/A 06/15/2020   Procedure: ENDOSCOPIC RETROGRADE CHOLANGIOPANCREATOGRAPHY (ERCP) WITH PROPOFOL ;  Surgeon: Marnee Sink, MD;  Location: [facility];  Service: Endoscopy;  Laterality: N/A;    Prior to Admission medications   Medication Sig Start Date End Date Taking? Authorizing Provider  Acetaminophen  325 MG CAPS Take 325-650 mg by mouth as needed for mild pain.   Yes [provider]  benzonatate (TESSALON) 100 MG capsule Take 100 mg by mouth 3 (three) times daily as needed for cough.   Yes [provider]  busPIRone (BUSPAR) 5 MG tablet Take 5 mg by mouth daily.   Yes [provider]  calcium-vitamin D (OSCAL WITH D) 500-200 MG-UNIT per tablet Take 1 tablet by mouth 2 (two) times daily.   Yes [provider]  Cholecalciferol 5000 UNITS capsule Take 5,000 Units by mouth daily.   Yes [provider]  donepezil  (ARICEPT ) 10 MG tablet Take 10 mg by  mouth daily. 05/14/20  Yes [provider]  famotidine  (PEPCID ) 20 MG tablet Take 20 mg by mouth daily. 05/14/20  Yes [provider]  fluticasone  (FLONASE ) 50 MCG/ACT nasal spray Place 2 sprays into both nostrils daily. 12/21/19  Yes [provider]  gabapentin  (NEURONTIN ) 300 MG capsule Take 300 mg by mouth 2 (two) times daily.   Yes [provider]  lamoTRIgine  (LAMICTAL ) 150 MG tablet Take 150 mg by mouth 2 (two) times daily. 05/14/20  Yes [provider]  loperamide (IMODIUM A-D) 2 MG tablet Take 2 mg by mouth 4 (four) times daily as needed for diarrhea or loose stools.   Yes [provider]  memantine (NAMENDA) 10 MG tablet Take 10 mg by mouth 2 (two) times daily.   Yes [provider]  nystatin (MYCOSTATIN/NYSTOP) 100000 UNIT/GM POWD Apply 1 Bottle topically 2 (two) times daily as needed. Apply a small amount   Yes [provider]  polyethylene glycol (MIRALAX  / GLYCOLAX ) packet Take 17 g by mouth daily as needed for mild constipation.   Yes [provider]  QUEtiapine  (SEROQUEL  XR) 50 MG TB24 24 hr tablet Take 25 mg by mouth at bedtime. 05/14/20  Yes [provider]  traZODone  (DESYREL ) 100 MG tablet Take 50 mg by mouth at bedtime as needed for sleep.   Yes [provider]  triamcinolone cream (KENALOG) 0.1 % Apply 1 application topically daily as needed.   Yes [provider]  vitamin B-12 (CYANOCOBALAMIN)  1000 MCG tablet Take 1,000 mcg by mouth daily.   Yes [provider]    Allergies as of 08/17/2023 - Review Complete 04/14/2023  Allergen Reaction Noted   Chocolate  04/14/2023   Dilantin [phenytoin sodium extended]  12/11/2014   Asa [aspirin] Swelling and Rash 12/11/2014   Butalbital Rash 04/14/2023   Codeine Rash 12/11/2014    Family History  Problem Relation Age of Onset   Breast cancer Maternal Aunt    Breast cancer Maternal Grandmother     Social History    Socioeconomic History   Marital status: Single    Spouse name: Not on file   Number of children: Not on file   Years of education: Not on file   Highest education level: Not on file  Occupational History   Not on file  Tobacco Use   Smoking status: Never   Smokeless tobacco: Never  Vaping Use   Vaping status: Never Used  Substance and Sexual Activity   Alcohol use: No   Drug use: No   Sexual activity: Yes    Birth control/protection: None, Surgical  Other Topics Concern   Not on file  Social History Narrative   Not on file   Social Drivers of Health   Financial Resource Strain: Low Risk  (03/01/2023)   Received from [facility]   Overall Financial Resource Strain (CARDIA)    Difficulty of Paying Living Expenses: Not hard at all  Food Insecurity: No Food Insecurity (03/01/2023)   Received from [facility]   Hunger Vital Sign    Within the past 12 months, the food you bought just didn't last and you didn't have money to get more.: Never true    Within the past 12 months, you worried that your food would run out before you got the money to buy more.: Never true  Transportation Needs: No Transportation Needs (03/01/2023)   Received from [facility]   PRAPARE - Transportation    Lack of Transportation (Non-Medical): No    In the past 12 months, has lack of transportation kept you from medical appointments or from getting medications?: No  Physical Activity: Not on file  Stress: Not on file  Social Connections: Not on file  Intimate Partner Violence: Not on file    Review of Systems: See HPI, otherwise negative ROS  Physical Exam: BP 123/78   Pulse (!) 59   Temp (!) 97.3 F (36.3 C) (Temporal)   Resp 16   Ht 5' 6 (1.676 m)   Wt 77.1 kg   SpO2 96%   BMI 27.44 kg/m  General:   Alert, cooperative. Head:  Normocephalic and atraumatic. Respiratory:  Normal work of breathing. Cardiovascular:   NAD  Impression/Plan: Tina Holden is here for cataract surgery.  Risks, benefits, limitations, and alternatives regarding cataract surgery have been reviewed with the patient.  Questions have been answered.  All parties agreeable.   Clair Crews, MD  09/07/2023, 7:18 AM

## 2023-09-07 NOTE — Anesthesia Postprocedure Evaluation (Signed)
 Anesthesia Post Note  Patient: Ottilia Pippenger Aguinaldo  Procedure(s) Performed: PHACOEMULSIFICATION, CATARACT, WITH IOL INSERTION 7.67 00:41.7 (Left: Eye)  Patient location during evaluation: PACU Anesthesia Type: MAC Level of consciousness: awake and alert Pain management: pain level controlled Vital Signs Assessment: post-procedure vital signs reviewed and stable Respiratory status: spontaneous breathing, nonlabored ventilation, respiratory function stable and patient connected to nasal cannula oxygen Cardiovascular status: stable and blood pressure returned to baseline Postop Assessment: no apparent nausea or vomiting Anesthetic complications: no   No notable events documented.   Last Vitals:  Vitals:   09/07/23 0745 09/07/23 0750  BP: 124/74 132/76  Pulse: (!) 53 (!) 55  Resp: 14 (!) 24  Temp: (!) 36.2 C (!) 36.2 C  SpO2: 96% 96%    Last Pain:  Vitals:   09/07/23 0750  TempSrc:   PainSc: 0-No pain                 Nancey Awkward

## 2023-09-07 NOTE — Transfer of Care (Signed)
 Immediate Anesthesia Transfer of Care Note  Patient: Tina Holden  Procedure(s) Performed: PHACOEMULSIFICATION, CATARACT, WITH IOL INSERTION 7.67 00:41.7 (Left: Eye)  Patient Location: PACU  Anesthesia Type: MAC  Level of Consciousness: awake, alert  and patient cooperative  Airway and Oxygen Therapy: Patient Spontanous Breathing and Patient connected to supplemental oxygen  Post-op Assessment: Post-op Vital signs reviewed, Patient's Cardiovascular Status Stable, Respiratory Function Stable, Patent Airway and No signs of Nausea or vomiting  Post-op Vital Signs: Reviewed and stable  Complications: No notable events documented.

## 2023-09-08 ENCOUNTER — Encounter: Payer: Self-pay | Admitting: Ophthalmology
